# Patient Record
Sex: Male | Born: 1950 | Hispanic: No | Marital: Married | Smoking: Never smoker
Health system: Southern US, Community
[De-identification: ages and names within clinical notes are randomized; demographics above are authoritative.]

## PROBLEM LIST (undated history)

## (undated) DIAGNOSIS — I10 Essential (primary) hypertension: Secondary | ICD-10-CM

## (undated) DIAGNOSIS — C801 Malignant (primary) neoplasm, unspecified: Secondary | ICD-10-CM

## (undated) DIAGNOSIS — Z800 Family history of malignant neoplasm of digestive organs: Secondary | ICD-10-CM

## (undated) DIAGNOSIS — G4730 Sleep apnea, unspecified: Secondary | ICD-10-CM

## (undated) DIAGNOSIS — Z8546 Personal history of malignant neoplasm of prostate: Secondary | ICD-10-CM

## (undated) DIAGNOSIS — E079 Disorder of thyroid, unspecified: Secondary | ICD-10-CM

## (undated) DIAGNOSIS — Z9689 Presence of other specified functional implants: Secondary | ICD-10-CM

## (undated) DIAGNOSIS — R809 Proteinuria, unspecified: Secondary | ICD-10-CM

## (undated) DIAGNOSIS — F909 Attention-deficit hyperactivity disorder, unspecified type: Secondary | ICD-10-CM

## (undated) DIAGNOSIS — I8390 Asymptomatic varicose veins of unspecified lower extremity: Secondary | ICD-10-CM

## (undated) DIAGNOSIS — T7840XA Allergy, unspecified, initial encounter: Secondary | ICD-10-CM

## (undated) DIAGNOSIS — G4700 Insomnia, unspecified: Secondary | ICD-10-CM

## (undated) DIAGNOSIS — K219 Gastro-esophageal reflux disease without esophagitis: Secondary | ICD-10-CM

## (undated) HISTORY — DX: Allergy, unspecified, initial encounter: T78.40XA

## (undated) HISTORY — DX: Insomnia, unspecified: G47.00

## (undated) HISTORY — DX: Personal history of malignant neoplasm of prostate: Z85.46

## (undated) HISTORY — DX: Essential (primary) hypertension: I10

## (undated) HISTORY — DX: Disorder of thyroid, unspecified: E07.9

## (undated) HISTORY — DX: Asymptomatic varicose veins of unspecified lower extremity: I83.90

## (undated) HISTORY — DX: Attention-deficit hyperactivity disorder, unspecified type: F90.9

## (undated) HISTORY — DX: Family history of malignant neoplasm of digestive organs: Z80.0

## (undated) HISTORY — DX: Proteinuria, unspecified: R80.9

## (undated) HISTORY — PX: PENILE PROSTHESIS IMPLANT: SHX240

---
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
## 2009-01-12 HISTORY — PX: HERNIA REPAIR: SHX51

## 2009-02-01 ENCOUNTER — Ambulatory Visit: Payer: Self-pay | Admitting: General Surgery

## 2009-11-01 ENCOUNTER — Ambulatory Visit: Payer: Self-pay | Admitting: Family Medicine

## 2010-01-12 DIAGNOSIS — C801 Malignant (primary) neoplasm, unspecified: Secondary | ICD-10-CM

## 2010-01-12 HISTORY — DX: Malignant (primary) neoplasm, unspecified: C80.1

## 2010-01-12 HISTORY — PX: PROSTATECTOMY: SHX69

## 2010-04-10 ENCOUNTER — Ambulatory Visit: Payer: Self-pay | Admitting: Urology

## 2012-03-29 DIAGNOSIS — G4721 Circadian rhythm sleep disorder, delayed sleep phase type: Secondary | ICD-10-CM | POA: Insufficient documentation

## 2012-04-18 DIAGNOSIS — G4733 Obstructive sleep apnea (adult) (pediatric): Secondary | ICD-10-CM | POA: Insufficient documentation

## 2013-03-27 IMAGING — CT CT ABDOMEN AND PELVIS WITHOUT AND WITH CONTRAST
2 of 4 series · 14 of 32 positions shown, 19 images · IV contrast (isovue)
Comparison: none

REASON FOR EXAM: prostate cancer
COMMENTS:

PROCEDURE:     KCT - KCT ABDOMEN/PELVIS W/WO  - April 10, 2010 [DATE]
RESULT:     Comparison:  None
TECHNIQUE: Multiple axial images of the abdomen and pelvis were performed
from the lung bases to the pubic symphysis, with p.o. contrast and with 85
mL of Isovue 370 intravenous contrast. Precontrast and delayed phase images
were also performed.

[Series 4: abd with 5.0 i40f · axial · 0.74mm/px · z∈[-938,-588]mm · 8 of 92 slices shown, 13 images]
[im 11/92  soft-tissue]
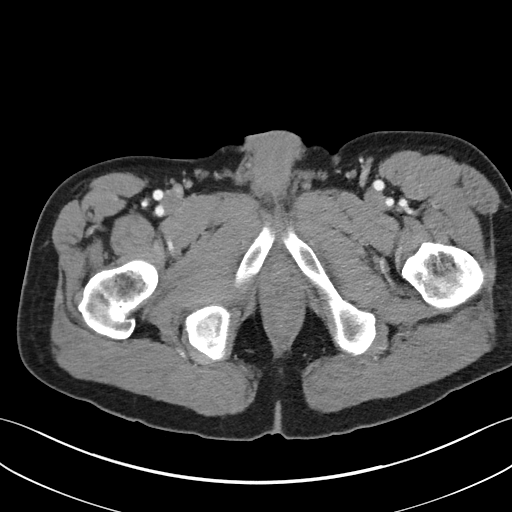
[im 11/92  bone]
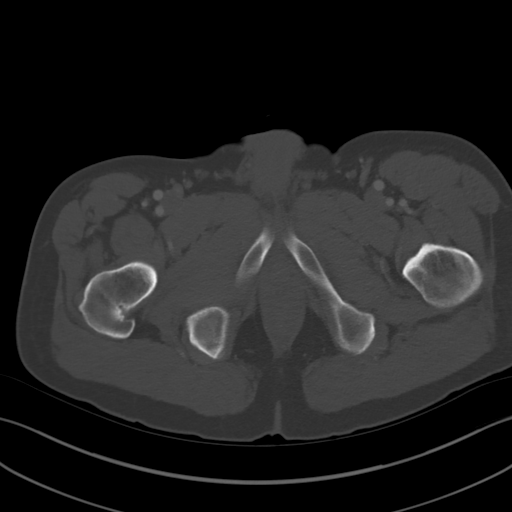
[im 21/92  soft-tissue]
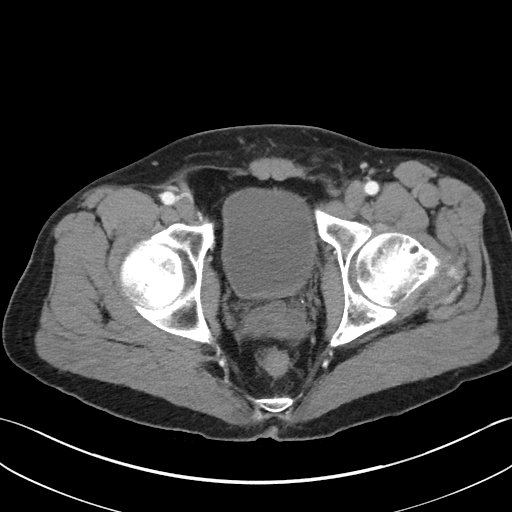
[im 31/92  soft-tissue]
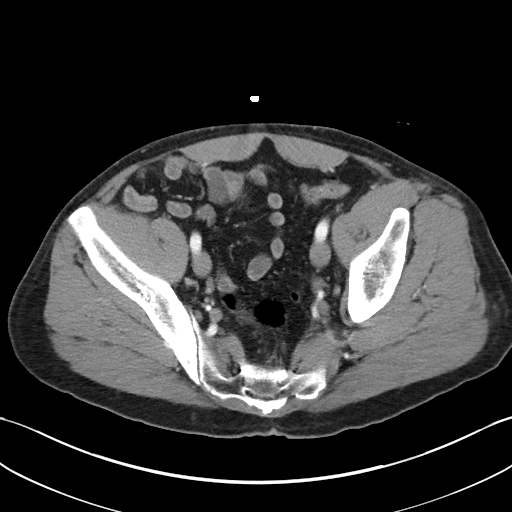
[im 41/92  soft-tissue]
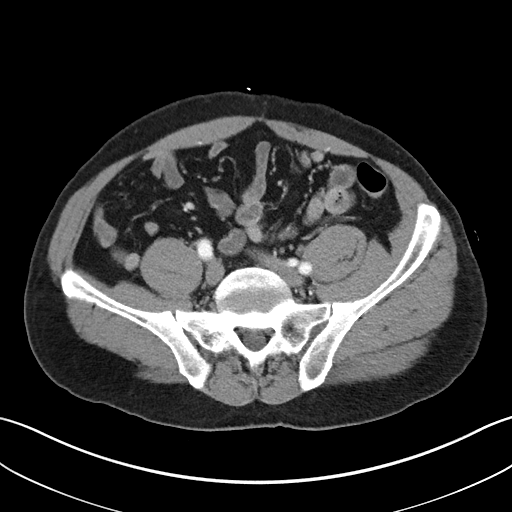
[im 51/92  soft-tissue]
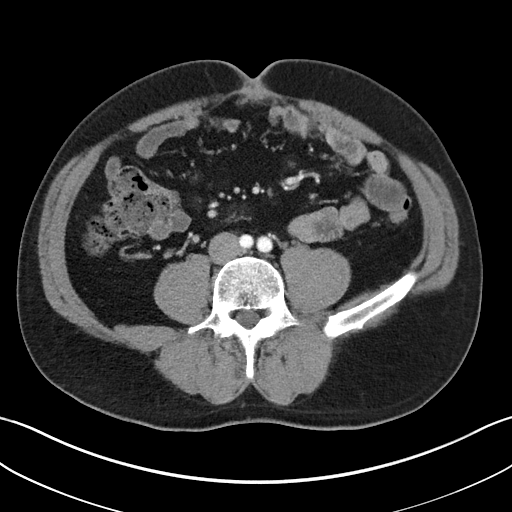
[im 51/92  lung]
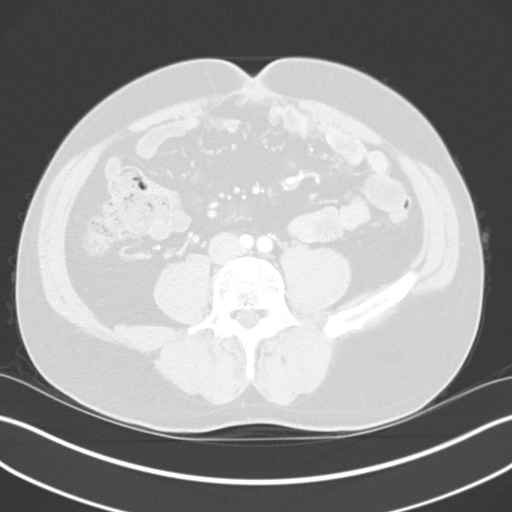
[im 61/92  soft-tissue]
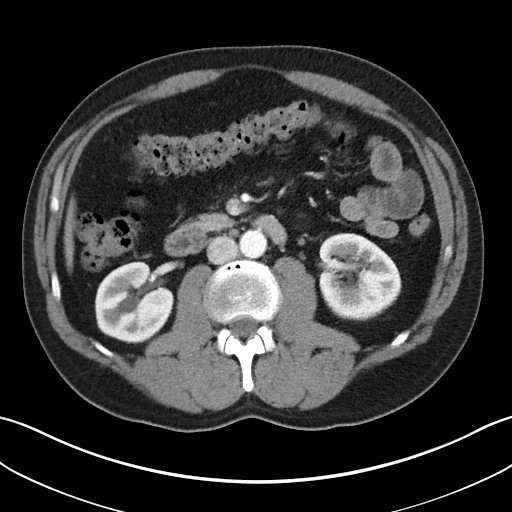
[im 61/92  lung]
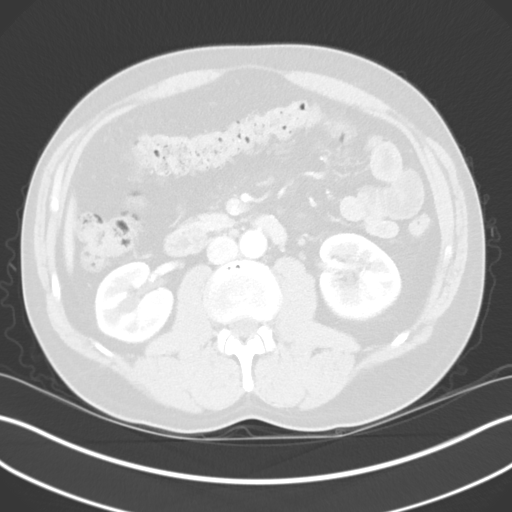
[im 71/92  soft-tissue]
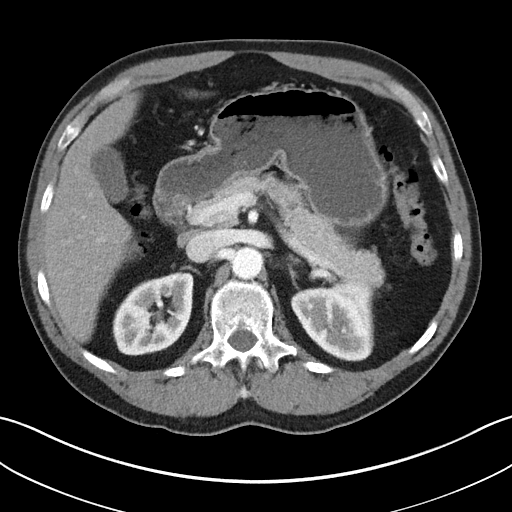
[im 71/92  lung]
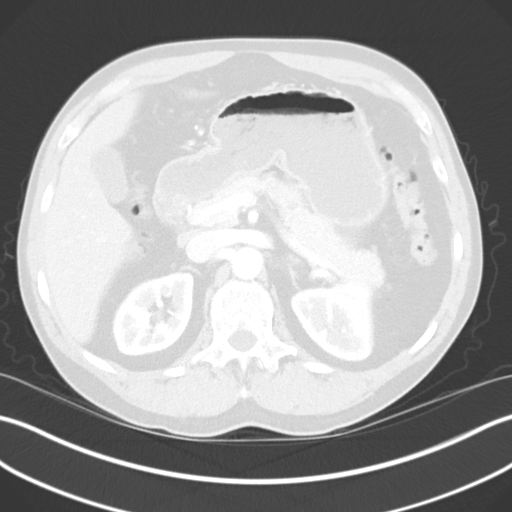
[im 81/92  soft-tissue]
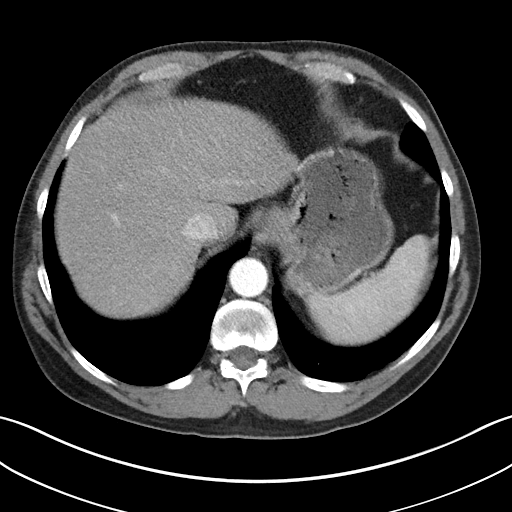
[im 81/92  lung]
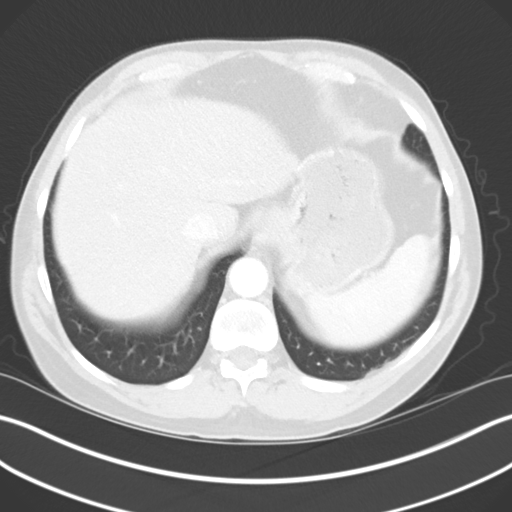

[Series 6: abd delay 5.0 i40f · axial · delayed · 0.74mm/px · z∈[-938,-688]mm · 6 of 92 slices shown]
[im 11/92  soft-tissue]
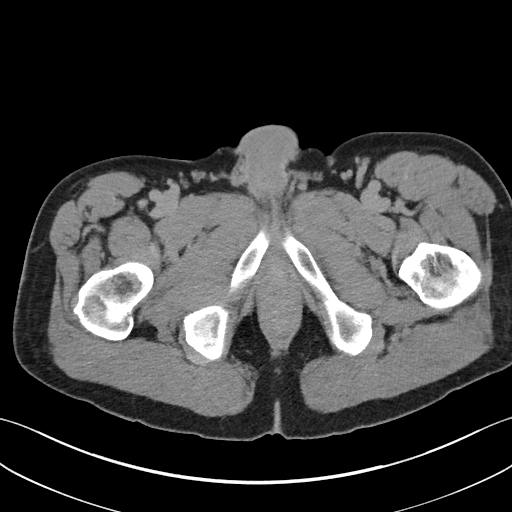
[im 21/92  soft-tissue]
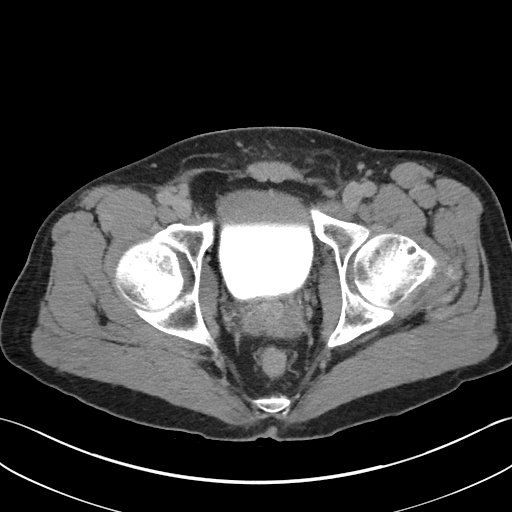
[im 31/92  soft-tissue]
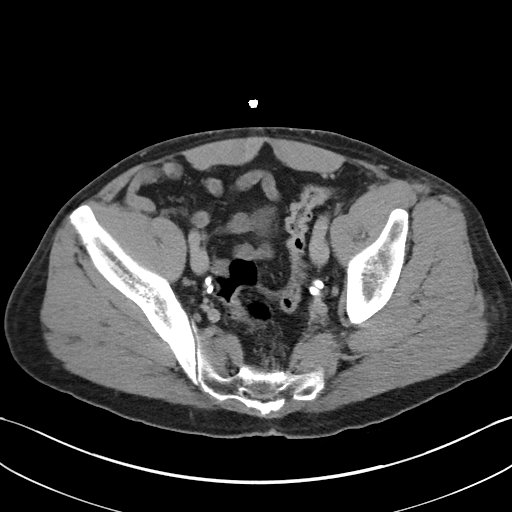
[im 41/92  soft-tissue]
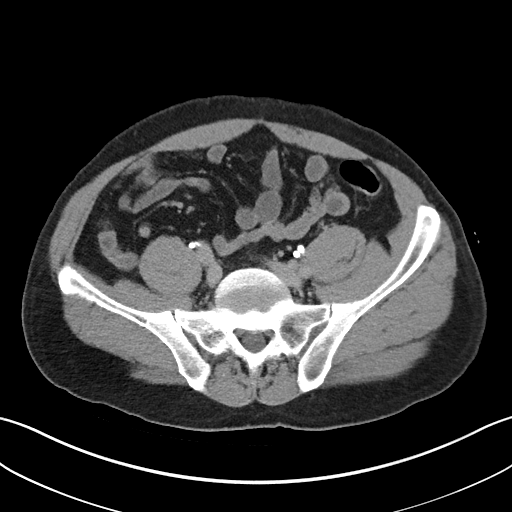
[im 51/92  soft-tissue]
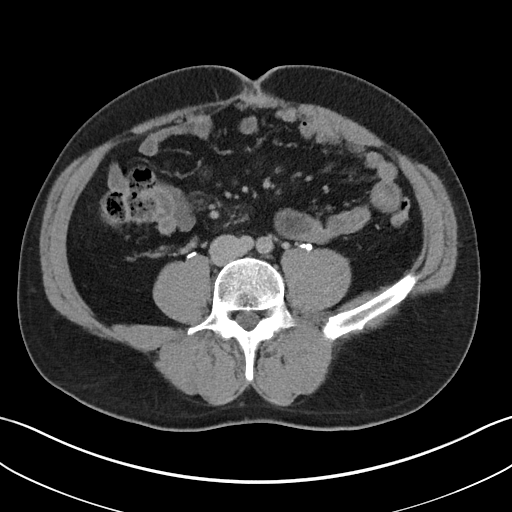
[im 61/92  soft-tissue]
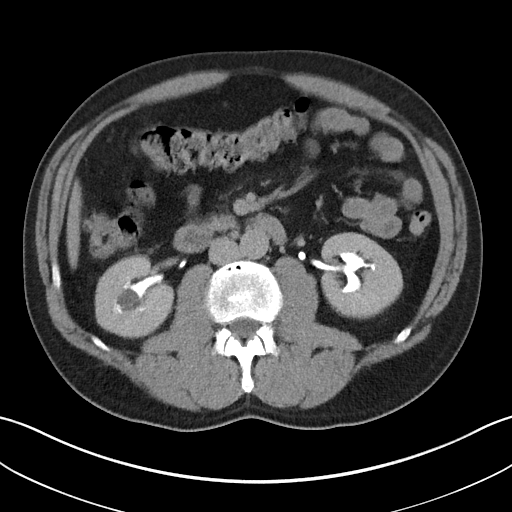

[14 of 32 positions shown; findings below may reference images not displayed]

FINDINGS: The liver, adrenals, pancreas, and gallbladder are unremarkable. Punctate
calcifications in the spleen are likely sequela of old prior infection. The
kidneys enhance normally.

There is a small round focus of hyperenhancement in the left posterior
aspect of the prostate which measures 1.1 cm in diameter, as seen on image
79. No lymphadenopathy identified in the retroperitoneum, mesentery, or
pelvis. There is a fat-containing left inguinal hernia. There is mild
stranding in the fat of the mesentery in the region of the mesenteric
vessels. This is nonspecific.

Evaluation of the delayed phase images demonstrates the bilateral ureters
are duplicated. There is a very small diverticulum extending from the right
posterior aspect of the bladder.

No aggressive lytic or sclerotic osseous lesions identified.
IMPRESSION: 1. Small hyperenhancing round focus in the prostate is nonspecific, prostate
cancer is a differential consideration.
2. No lymphadenopathy in the abdomen or pelvis.
3. Mild fat stranding in the mesentery is nonspecific. This can be seen with
mesenteritis or panniculitis.

## 2014-01-12 HISTORY — PX: COLONOSCOPY: SHX174

## 2014-05-14 DIAGNOSIS — N393 Stress incontinence (female) (male): Secondary | ICD-10-CM | POA: Insufficient documentation

## 2014-08-23 ENCOUNTER — Encounter (INDEPENDENT_AMBULATORY_CARE_PROVIDER_SITE_OTHER): Payer: Self-pay

## 2014-08-23 ENCOUNTER — Ambulatory Visit (INDEPENDENT_AMBULATORY_CARE_PROVIDER_SITE_OTHER): Payer: BLUE CROSS/BLUE SHIELD | Admitting: Family Medicine

## 2014-08-23 ENCOUNTER — Encounter: Payer: Self-pay | Admitting: Family Medicine

## 2014-08-23 VITALS — BP 106/64 | HR 91 | Temp 98.3°F | Resp 18 | Ht 76.0 in | Wt 199.5 lb

## 2014-08-23 DIAGNOSIS — K432 Incisional hernia without obstruction or gangrene: Secondary | ICD-10-CM | POA: Diagnosis not present

## 2014-08-23 DIAGNOSIS — Z23 Encounter for immunization: Secondary | ICD-10-CM

## 2014-08-23 DIAGNOSIS — K589 Irritable bowel syndrome without diarrhea: Secondary | ICD-10-CM

## 2014-08-23 DIAGNOSIS — F419 Anxiety disorder, unspecified: Secondary | ICD-10-CM

## 2014-08-23 DIAGNOSIS — G4700 Insomnia, unspecified: Secondary | ICD-10-CM | POA: Insufficient documentation

## 2014-08-23 MED ORDER — PANTOPRAZOLE SODIUM 40 MG PO TBEC: 40.0000 mg | DELAYED_RELEASE_TABLET | Freq: Every day | ORAL | Status: DC

## 2014-08-23 MED ORDER — ALPRAZOLAM 0.5 MG PO TABS: ORAL_TABLET | ORAL | Status: DC

## 2014-08-23 MED ORDER — ZOLPIDEM TARTRATE 10 MG PO TABS: 10.0000 mg | ORAL_TABLET | Freq: Every day | ORAL | Status: DC

## 2014-08-23 NOTE — Progress Notes (Signed)
Name: Brandon Huffman   MRN: [id number]    DOB: [birth year]   Date:08/23/2014       Progress Note  Subjective  Chief Complaint  Chief Complaint  Patient presents with  . Anxiety  . Insomnia    Anxiety Presents for follow-up visit. Symptoms include excessive worry, hyperventilation, insomnia, irritability, muscle tension, palpitations, restlessness and shortness of breath. Patient reports no chest pain, dizziness, nausea or nervous/anxious behavior. Symptoms occur occasionally. The severity of symptoms is moderate. The symptoms are aggravated by caffeine, social activities and work stress. The quality of sleep is good. Nighttime awakenings: occasional.   His past medical history is significant for anxiety/panic attacks. Past treatments include benzodiazephines. The treatment provided moderate relief. Compliance with prior treatments has been good. Compliance with medications is 76-100%.  Insomnia Primary symptoms: fragmented sleep, sleep disturbance, difficulty falling asleep, frequent awakening.  The current episode started more than one year. The onset quality is gradual. The problem occurs nightly. The problem is unchanged. The symptoms are aggravated by anxiety and work stress. The treatment provided moderate relief. PMH includes: no depression.   Incisional hernia.  Patient has had nasal herniorrhaphy as well as radical prostatectomy. Robotically performed. He has a small bulge above the umbilicus which is tender intermittently.   Past Medical History  Diagnosis Date  . Allergy   . Anxiety   . Insomnia    history of robotic prostatectomy History of umbilical surgery and inguinal hernia surgery  Social History  Substance Use Topics  . Smoking status: Never Smoker   . Smokeless tobacco: Not on file  . Alcohol Use: No     Current outpatient prescriptions:  .  aspirin 81 MG chewable tablet, Chew 81 mg by mouth., Disp: , Rfl:  .  fluticasone (FLONASE) 50 MCG/ACT nasal spray, ,  Disp: , Rfl:  .  gabapentin (NEURONTIN) 300 MG capsule, , Disp: , Rfl:  .  ALPRAZolam (XANAX) 0.5 MG tablet, TAKE 1 TABLET BY MOUTH 2 TIMES A DAY AS NEEDED, Disp: , Rfl: 4 .  fexofenadine (ALLEGRA) 180 MG tablet, , Disp: , Rfl:  .  Melatonin 3 MG TABS, Take by mouth., Disp: , Rfl:  .  Multiple Vitamins-Minerals (MULTIVITAMIN WITH MINERALS) tablet, Take by mouth., Disp: , Rfl:  .  pantoprazole (PROTONIX) 40 MG tablet, , Disp: , Rfl: 2 .  zolpidem (AMBIEN) 10 MG tablet, Take 10 mg by mouth at bedtime., Disp: , Rfl: 5  Allergies  Allergen Reactions  . Tetracyclines & Related Hives and Rash    Review of Systems  Constitutional: Positive for irritability. Negative for fever, chills and weight loss.  HENT: Negative for congestion, hearing loss, sore throat and tinnitus.   Eyes: Negative for blurred vision, double vision and redness.  Respiratory: Positive for shortness of breath. Negative for cough and hemoptysis.   Cardiovascular: Positive for palpitations. Negative for chest pain, orthopnea, claudication and leg swelling.  Gastrointestinal: Negative for heartburn, nausea, vomiting, diarrhea, constipation and blood in stool.  Genitourinary: Negative for dysuria, urgency, frequency and hematuria.  Musculoskeletal: Negative for myalgias, back pain, joint pain, falls and neck pain.  Skin: Negative for itching.  Neurological: Negative for dizziness, tingling, tremors, focal weakness, seizures, loss of consciousness, weakness and headaches.  Endo/Heme/Allergies: Does not bruise/bleed easily.  Psychiatric/Behavioral: Positive for sleep disturbance. Negative for depression and substance abuse. The patient has insomnia. The patient is not nervous/anxious.      Objective  Filed Vitals:   08/23/14 0735  BP: 106/64  Pulse: 91  Temp: 98.3 F (36.8 C)  TempSrc: Oral  Resp: 18  Height: 6\' 4"  (1.93 m)  Weight: 199 lb 8 oz (90.493 kg)  SpO2: 98%     Physical Exam  Constitutional: He is  oriented to person, place, and time and well-developed, well-nourished, and in no distress.  HENT:  Head: Normocephalic.  Eyes: EOM are normal. Pupils are equal, round, and reactive to light.  Neck: Normal range of motion. Neck supple. No thyromegaly present.  Cardiovascular: Normal rate, regular rhythm and normal heart sounds.   No murmur heard. Pulmonary/Chest: Effort normal and breath sounds normal. No respiratory distress. He has no wheezes.  Abdominal: He exhibits mass. There is tenderness.  Surgical scars noted from prior prostatectomy and umbilical hernia surgery. There's a slight bulge bulge just left to the umbilicus but no incarceration of small incisional hernia.  Musculoskeletal: Normal range of motion. He exhibits no edema.  Lymphadenopathy:    He has no cervical adenopathy.  Neurological: He is alert and oriented to person, place, and time. No cranial nerve deficit. Gait normal. Coordination normal.  Skin: Skin is warm and dry. No rash noted.  Psychiatric: Judgment normal.  Slightly anxious and loquacious      Assessment & Plan  1. Insomnia  - zolpidem (AMBIEN) 10 MG tablet; Take 1 tablet (10 mg total) by mouth at bedtime.  Dispense: 30 tablet; Refill: 5  2. Acute anxiety  - ALPRAZolam (XANAX) 0.5 MG tablet; TAKE 1 TABLET BY MOUTH 2 TIMES A DAY AS NEEDED  Dispense: 60 tablet; Refill: 5  3. Incisional hernia, without obstruction or gangrene  4. Need for shingles vaccine  - Varicella-zoster vaccine subcutaneous  5. Irritable bowel syndrome  Stable

## 2014-09-30 ENCOUNTER — Other Ambulatory Visit: Payer: Self-pay | Admitting: Family Medicine

## 2014-10-01 NOTE — Telephone Encounter (Signed)
Written on last ov If not, ok to call for now

## 2014-10-09 NOTE — Telephone Encounter (Signed)
Script faxed to pharmacy

## 2014-11-16 DIAGNOSIS — R972 Elevated prostate specific antigen [PSA]: Secondary | ICD-10-CM | POA: Insufficient documentation

## 2014-11-18 ENCOUNTER — Other Ambulatory Visit: Payer: Self-pay | Admitting: Family Medicine

## 2015-02-25 ENCOUNTER — Encounter: Payer: BLUE CROSS/BLUE SHIELD | Admitting: Family Medicine

## 2015-03-21 ENCOUNTER — Ambulatory Visit (INDEPENDENT_AMBULATORY_CARE_PROVIDER_SITE_OTHER): Payer: BLUE CROSS/BLUE SHIELD | Admitting: Family Medicine

## 2015-03-21 ENCOUNTER — Encounter: Payer: Self-pay | Admitting: Family Medicine

## 2015-03-21 VITALS — BP 120/72 | HR 99 | Temp 98.1°F | Resp 18 | Ht 76.0 in | Wt 188.4 lb

## 2015-03-21 DIAGNOSIS — Z0000 Encounter for general adult medical examination without abnormal findings: Secondary | ICD-10-CM | POA: Diagnosis not present

## 2015-03-21 NOTE — Progress Notes (Signed)
Name: Brandon Huffman   MRN: [id number]    DOB: 07/28/1950   Date:03/21/2015       Progress Note  Subjective  Chief Complaint  Chief Complaint  Patient presents with  . Annual Exam    HPI  Pt. Is here for a CPE.  Colonoscopy was 11/2012, repeat due 11/2015. Had radical prostatectomy in 2012, follows up with Urology with regular PSA draws.  Past Medical History  Diagnosis Date  . Allergy   . Anxiety   . Insomnia   . Insomnia     Past Surgical History  Procedure Laterality Date  . Colonoscopy  2016  . Prostatectomy  2012    Radical Prostatectomy.  Marland Kitchen Hernia repair    . Varicose vein surgery Left 1972    Family History  Problem Relation Age of Onset  . Cancer Sister     Colon CA  . Cancer Brother     Prostate CA  . Pneumonia Mother     Social History   Social History  . Marital Status: Married    Spouse Name: N/A  . Number of Children: N/A  . Years of Education: N/A   Occupational History  . Not on file.   Social History Main Topics  . Smoking status: Never Smoker   . Smokeless tobacco: Not on file  . Alcohol Use: 1.2 oz/week    2 Glasses of wine, 0 Standard drinks or equivalent per week  . Drug Use: No  . Sexual Activity:    Partners: Female   Other Topics Concern  . Not on file   Social History Narrative     Current outpatient prescriptions:  .  ALPRAZolam (XANAX) 0.5 MG tablet, TAKE 1 TABLET BY MOUTH 2 TIMES A DAY AS NEEDED, Disp: 60 tablet, Rfl: 5 .  aspirin 81 MG chewable tablet, Chew 81 mg by mouth., Disp: , Rfl:  .  fluticasone (FLONASE) 50 MCG/ACT nasal spray, Reported on 03/21/2015, Disp: , Rfl:  .  Melatonin 3 MG TABS, Take by mouth. Reported on 03/21/2015, Disp: , Rfl:  .  pantoprazole (PROTONIX) 40 MG tablet, TAKE 1 TABLET EVERY DAY, Disp: 90 tablet, Rfl: 3 .  zolpidem (AMBIEN) 10 MG tablet, TAKE 1 TABLET BY MOUTH AT BEDTIME, Disp: 30 tablet, Rfl: 0 .  fexofenadine (ALLEGRA) 180 MG tablet, Reported on 03/21/2015, Disp: , Rfl:  .  gabapentin  (NEURONTIN) 300 MG capsule, Reported on 03/21/2015, Disp: , Rfl:  .  Humidifier MISC, , Disp: , Rfl:  .  Multiple Vitamins-Minerals (MULTIVITAMIN WITH MINERALS) tablet, Take by mouth., Disp: , Rfl:   Allergies  Allergen Reactions  . Tetracyclines & Related Hives and Rash     Review of Systems  Constitutional: Negative for fever, chills, weight loss and malaise/fatigue.  HENT: Negative for congestion, ear pain and hearing loss.   Eyes: Negative for blurred vision and double vision.  Respiratory: Negative for cough, shortness of breath and wheezing.   Cardiovascular: Negative for chest pain and leg swelling.  Gastrointestinal: Positive for heartburn (controlled on PPI). Negative for nausea, vomiting, abdominal pain, blood in stool and melena.  Genitourinary: Negative for dysuria, frequency and hematuria.  Musculoskeletal: Negative for myalgias, back pain and neck pain.  Skin: Negative for rash.  Neurological: Negative for dizziness and headaches.  Psychiatric/Behavioral: Negative for depression. The patient has insomnia. The patient is not nervous/anxious.     Objective  Filed Vitals:   03/21/15 0905  BP: 120/72  Pulse: 99  Temp: 98.1 F (  36.7 C)  TempSrc: Oral  Resp: 18  Height: 6\' 4"  (1.93 m)  Weight: 188 lb 6.4 oz (85.458 kg)  SpO2: 97%    Physical Exam  Constitutional: He is oriented to person, place, and time and well-developed, well-nourished, and in no distress.  HENT:  Head: Normocephalic and atraumatic.  Eyes: Pupils are equal, round, and reactive to light.  Cardiovascular: Normal rate and regular rhythm.   Pulmonary/Chest: Effort normal and breath sounds normal.  Abdominal: Soft. Bowel sounds are normal.  Neurological: He is alert and oriented to person, place, and time.  Nursing note and vitals reviewed.     Assessment & Plan  1. Annual physical exam  - Lipid Profile - CBC with Differential - Comprehensive Metabolic Panel (CMET) - TSH - Vitamin D  (25 hydroxy)   Carine Nordgren [facility] 03/21/2015 9:46 AM

## 2015-04-04 LAB — CBC WITH DIFFERENTIAL/PLATELET
Basophils Absolute: 0 10*3/uL (ref 0.0–0.2)
Basos: 1 %
EOS (ABSOLUTE): 0.1 10*3/uL (ref 0.0–0.4)
Eos: 2 %
Hematocrit: 41.3 % (ref 37.5–51.0)
Hemoglobin: 14.2 g/dL (ref 12.6–17.7)
Immature Grans (Abs): 0 10*3/uL (ref 0.0–0.1)
Immature Granulocytes: 0 %
LYMPHS: 30 %
Lymphocytes Absolute: 1.5 10*3/uL (ref 0.7–3.1)
MCH: 28.8 pg (ref 26.6–33.0)
MCHC: 34.4 g/dL (ref 31.5–35.7)
MCV: 84 fL (ref 79–97)
Monocytes Absolute: 0.3 10*3/uL (ref 0.1–0.9)
Monocytes: 7 %
NEUTROS ABS: 2.9 10*3/uL (ref 1.4–7.0)
Neutrophils: 60 %
Platelets: 191 10*3/uL (ref 150–379)
RBC: 4.93 x10E6/uL (ref 4.14–5.80)
RDW: 15.7 % — ABNORMAL HIGH (ref 12.3–15.4)
WBC: 4.8 10*3/uL (ref 3.4–10.8)

## 2015-04-04 LAB — COMPREHENSIVE METABOLIC PANEL
ALT: 45 IU/L — ABNORMAL HIGH (ref 0–44)
AST: 41 IU/L — ABNORMAL HIGH (ref 0–40)
Albumin/Globulin Ratio: 1 — ABNORMAL LOW (ref 1.2–2.2)
Albumin: 4.1 g/dL (ref 3.6–4.8)
Alkaline Phosphatase: 94 IU/L (ref 39–117)
BUN/Creatinine Ratio: 10 (ref 10–22)
BUN: 9 mg/dL (ref 8–27)
Bilirubin Total: 0.3 mg/dL (ref 0.0–1.2)
CO2: 23 mmol/L (ref 18–29)
Calcium: 9.8 mg/dL (ref 8.6–10.2)
Chloride: 100 mmol/L (ref 96–106)
Creatinine, Ser: 0.94 mg/dL (ref 0.76–1.27)
GFR calc Af Amer: 99 mL/min/{1.73_m2} (ref >59)
GFR calc non Af Amer: 86 mL/min/{1.73_m2} (ref >59)
GLUCOSE: 97 mg/dL (ref 65–99)
Globulin, Total: 4.3 g/dL (ref 1.5–4.5)
Potassium: 5.2 mmol/L (ref 3.5–5.2)
Sodium: 138 mmol/L (ref 134–144)
Total Protein: 8.4 g/dL (ref 6.0–8.5)

## 2015-04-04 LAB — LIPID PANEL
CHOL/HDL RATIO: 2.6 ratio (ref 0.0–5.0)
CHOLESTEROL TOTAL: 135 mg/dL (ref 100–199)
HDL: 52 mg/dL (ref >39)
LDL Calculated: 68 mg/dL (ref 0–99)
TRIGLYCERIDES: 74 mg/dL (ref 0–149)
VLDL Cholesterol Cal: 15 mg/dL (ref 5–40)

## 2015-04-04 LAB — TSH: TSH: 2.66 u[IU]/mL (ref 0.450–4.500)

## 2015-04-04 LAB — VITAMIN D 25 HYDROXY (VIT D DEFICIENCY, FRACTURES): Vit D, 25-Hydroxy: 29 ng/mL — ABNORMAL LOW (ref 30.0–100.0)

## 2015-04-24 ENCOUNTER — Other Ambulatory Visit: Payer: Self-pay | Admitting: Family Medicine

## 2015-11-05 ENCOUNTER — Other Ambulatory Visit: Payer: Self-pay | Admitting: Family Medicine

## 2015-12-13 HISTORY — PX: OTHER SURGICAL HISTORY: SHX169

## 2016-03-23 ENCOUNTER — Encounter: Payer: PRIVATE HEALTH INSURANCE | Admitting: Family Medicine

## 2016-05-08 ENCOUNTER — Ambulatory Visit (INDEPENDENT_AMBULATORY_CARE_PROVIDER_SITE_OTHER): Payer: BLUE CROSS/BLUE SHIELD | Admitting: Family Medicine

## 2016-05-08 ENCOUNTER — Encounter: Payer: Self-pay | Admitting: Family Medicine

## 2016-05-08 DIAGNOSIS — Z8546 Personal history of malignant neoplasm of prostate: Secondary | ICD-10-CM | POA: Diagnosis not present

## 2016-05-08 DIAGNOSIS — Z800 Family history of malignant neoplasm of digestive organs: Secondary | ICD-10-CM | POA: Diagnosis not present

## 2016-05-08 DIAGNOSIS — F5101 Primary insomnia: Secondary | ICD-10-CM

## 2016-05-08 DIAGNOSIS — Z0000 Encounter for general adult medical examination without abnormal findings: Secondary | ICD-10-CM | POA: Diagnosis not present

## 2016-05-08 DIAGNOSIS — Z23 Encounter for immunization: Secondary | ICD-10-CM

## 2016-05-08 DIAGNOSIS — K432 Incisional hernia without obstruction or gangrene: Secondary | ICD-10-CM | POA: Diagnosis not present

## 2016-05-08 HISTORY — DX: Family history of malignant neoplasm of digestive organs: Z80.0

## 2016-05-08 HISTORY — DX: Personal history of malignant neoplasm of prostate: Z85.46

## 2016-05-08 LAB — CBC WITH DIFFERENTIAL/PLATELET
BASOS ABS: 49 {cells}/uL (ref 0–200)
Basophils Relative: 1 %
Eosinophils Absolute: 98 cells/uL (ref 15–500)
Eosinophils Relative: 2 %
HEMATOCRIT: 43.7 % (ref 38.5–50.0)
HEMOGLOBIN: 14.5 g/dL (ref 13.2–17.1)
LYMPHS ABS: 1323 {cells}/uL (ref 850–3900)
Lymphocytes Relative: 27 %
MCH: 28.3 pg (ref 27.0–33.0)
MCHC: 33.2 g/dL (ref 32.0–36.0)
MCV: 85.4 fL (ref 80.0–100.0)
MONO ABS: 343 {cells}/uL (ref 200–950)
MPV: 10.3 fL (ref 7.5–12.5)
Monocytes Relative: 7 %
Neutro Abs: 3087 cells/uL (ref 1500–7800)
Neutrophils Relative %: 63 %
Platelets: 187 10*3/uL (ref 140–400)
RBC: 5.12 MIL/uL (ref 4.20–5.80)
RDW: 15.5 % — ABNORMAL HIGH (ref 11.0–15.0)
WBC: 4.9 10*3/uL (ref 3.8–10.8)

## 2016-05-08 LAB — LIPID PANEL
Cholesterol: 161 mg/dL (ref <200)
HDL: 41 mg/dL (ref >40)
LDL CALC: 89 mg/dL (ref <100)
TRIGLYCERIDES: 157 mg/dL — AB (ref <150)
Total CHOL/HDL Ratio: 3.9 Ratio (ref <5.0)
VLDL: 31 mg/dL — AB (ref <30)

## 2016-05-08 LAB — COMPREHENSIVE METABOLIC PANEL
ALBUMIN: 4.2 g/dL (ref 3.6–5.1)
ALT: 31 U/L (ref 9–46)
AST: 28 U/L (ref 10–35)
Alkaline Phosphatase: 71 U/L (ref 40–115)
BILIRUBIN TOTAL: 0.4 mg/dL (ref 0.2–1.2)
BUN: 10 mg/dL (ref 7–25)
CALCIUM: 9.3 mg/dL (ref 8.6–10.3)
CHLORIDE: 103 mmol/L (ref 98–110)
CO2: 28 mmol/L (ref 20–31)
Creat: 0.84 mg/dL (ref 0.70–1.25)
Glucose, Bld: 88 mg/dL (ref 65–99)
Potassium: 4.9 mmol/L (ref 3.5–5.3)
Sodium: 136 mmol/L (ref 135–146)
Total Protein: 8.1 g/dL (ref 6.1–8.1)

## 2016-05-08 NOTE — Assessment & Plan Note (Signed)
Refer back to Dr. Alain Marion for colonoscopy

## 2016-05-08 NOTE — Assessment & Plan Note (Signed)
Discussed new Shingrix if he's interested; he'll read about it

## 2016-05-08 NOTE — Progress Notes (Signed)
Patient ID: Brandon Huffman, male   DOB: 26-Apr-1950, 66 y.o.   MRN: [id number]   Subjective:   Brandon Huffman is a 66 y.o. male here for a complete physical exam  Interim issues since last visit: no major issues; artifical sphincter implant; deactived it and will see Dr. Odis Luster; a little painful Last physical was March 2017 with Dr. Manuella Ghazi Had alprazolam for sleep, not anxiety Hernia, not incarcerated Insomnia; does melatonin; no longer on alprazolam, has used ambien at times; sleep cycle gets off working on PhD Taking Vyvanse from psychiatrist, Dr. Bernita Raisin; formal ADHD adult  USPSTF grade A and B recommendations Depression:  Depression screen [facility] 2/9 05/08/2016 03/21/2015  Decreased Interest 0 -  Down, Depressed, Hopeless 0 0  PHQ - 2 Score 0 0   Hypertension: BP Readings from Last 3 Encounters:  05/08/16 120/74  03/21/15 120/72  08/23/14 106/64   Obesity: Wt Readings from Last 3 Encounters:  05/08/16 199 lb 3.2 oz (90.4 kg)  03/21/15 188 lb 6.4 oz (85.5 kg)  08/23/14 199 lb 8 oz (90.5 kg)   BMI Readings from Last 3 Encounters:  05/08/16 24.57 kg/m  03/21/15 22.93 kg/m  08/23/14 24.28 kg/m    Alcohol: one glass of wine every other day Tobacco use: no HIV, hep B, hep C:  Hep C UTD, declined HIV STD testing and prevention (chl/gon/syphilis): declined Lipids: doing flax seed omega Lab Results  Component Value Date   CHOL 135 04/03/2015   Lab Results  Component Value Date   HDL 52 04/03/2015   Lab Results  Component Value Date   LDLCALC 68 04/03/2015   Lab Results  Component Value Date   TRIG 74 04/03/2015   Lab Results  Component Value Date   CHOLHDL 2.6 04/03/2015   No results found for: LDLDIRECT  Glucose:  Glucose  Date Value Ref Range Status  04/03/2015 97 65 - 99 mg/dL Final    Colorectal cancer: due now for colonoscopy, referral placed Prostate cancer: amanged by urologist No results found for: PSA PSA 0.1 in Nov 2017, 0.06 in Jan  2018 Goes back May 4th Lung cancer:  never AAA: no fam hx of AAA Aspirin: taking 81 mg daily Diet: pretty good eater, lots of fish, very little fried foods Exercise: tries to exercise Skin cancer: check back  Past Medical History:  Diagnosis Date  . Adult ADHD    Dr. Rosine Door  . Allergy   . Family hx of colon cancer 05/08/2016  . Hx of malignant neoplasm of prostate 05/08/2016   2012, s/p radical prostatectomy, urologist is Dr. Tresa Endo [facility]  . Hx of malignant neoplasm of prostate 05/08/2016   2012, s/p radical prostatectomy, urologist is Dr. Tresa Endo [facility]  . Insomnia    Past Surgical History:  Procedure Laterality Date  . artificaill sphincter    . COLONOSCOPY  2016  . HERNIA REPAIR    . PROSTATECTOMY  2012   Radical Prostatectomy.  Marland Kitchen VARICOSE VEIN SURGERY Left 1972   Family History  Problem Relation Age of Onset  . Cancer Sister     Colon CA  . Cancer Brother     Prostate CA  . Pneumonia Mother   . Diabetes Brother   . Cancer Sister     adrenal   . Cancer Brother     prostate-remission  . Irritable bowel syndrome Brother    Social History  Substance Use Topics  . Smoking status: Never Smoker  . Smokeless tobacco:  Never Used  . Alcohol use 1.2 oz/week    2 Glasses of wine per week   Review of Systems  Constitutional: Negative for unexpected weight change.  HENT: Negative for hearing loss.   Eyes: Negative for visual disturbance.  Cardiovascular: Negative for chest pain.  Gastrointestinal: Negative for blood in stool.  Genitourinary: Negative for hematuria.  Musculoskeletal: Positive for arthralgias (right pinky).  Allergic/Immunologic: Positive for food allergies (chocolate, cysts under the skin, avoids).  Neurological: Negative for tremors.  Hematological: Negative for adenopathy.  Psychiatric/Behavioral: Positive for sleep disturbance.    Objective:   Vitals:   05/08/16 1108  BP: 120/74  Pulse: 98  Resp: 14  Temp: 98.6 F (37 C)   TempSrc: Oral  SpO2: 97%  Weight: 199 lb 3.2 oz (90.4 kg)  Height: 6' 3.5" (1.918 m)   Body mass index is 24.57 kg/m. Wt Readings from Last 3 Encounters:  05/08/16 199 lb 3.2 oz (90.4 kg)  03/21/15 188 lb 6.4 oz (85.5 kg)  08/23/14 199 lb 8 oz (90.5 kg)   Physical Exam  Constitutional: He appears well-developed and well-nourished. No distress.  HENT:  Head: Normocephalic and atraumatic.  Right Ear: Tympanic membrane, external ear and ear canal normal.  Left Ear: Tympanic membrane, external ear and ear canal normal.  Nose: Nose normal.  Mouth/Throat: Oropharynx is clear and moist and mucous membranes are normal.  Scant cerumen inferiorly along right external auditory canal  Eyes: EOM are normal. No scleral icterus.  Neck: No JVD present. No thyromegaly present.  Cardiovascular: Normal rate, regular rhythm and normal heart sounds.   Pulmonary/Chest: Effort normal and breath sounds normal. No respiratory distress. He has no wheezes. He has no rales.  Abdominal: Soft. Bowel sounds are normal. He exhibits no distension. There is no tenderness. There is no guarding.  Musculoskeletal: Normal range of motion. He exhibits no edema.  Lymphadenopathy:    He has no cervical adenopathy.  Neurological: He is alert. He displays normal reflexes. He exhibits normal muscle tone. Coordination normal.  Skin: Skin is warm and dry. No rash noted. He is not diaphoretic. No erythema. No pallor.  Two dark brown keratotic lesions on back, one near center, other over left shoulder  Psychiatric: He has a normal mood and affect. His behavior is normal. Judgment and thought content normal.   Assessment/Plan:   Problem List Items Addressed This Visit      Other   Insomnia    Melatonin at exact same time of night; avoid caffeine 6-10 hours before bed      Incisional hernia, without obstruction or gangrene    Check today      Hx of malignant neoplasm of prostate    Managed by urologist       Family hx of colon cancer    Refer back to Dr. Alain Marion for colonoscopy      Relevant Orders   Ambulatory referral to Gastroenterology   Annual physical exam    USPSTF grade A and B recommendations reviewed with patient; age-appropriate recommendations, preventive care, screening tests, etc discussed and encouraged; healthy living encouraged; see AVS for patient education given to patient       Relevant Orders   CBC with Differential/Platelet   Comprehensive metabolic panel   Lipid panel      Meds ordered this encounter  Medications  . oxybutynin (DITROPAN-XL) 10 MG 24 hr tablet    Sig: Take 10 mg by mouth as needed.   Orders Placed This Encounter  Procedures  . CBC with Differential/Platelet  . Comprehensive metabolic panel  . Lipid panel  . Ambulatory referral to Gastroenterology    Referral Priority:   Routine    Referral Type:   Consultation    Referral Reason:   Specialty Services Required    Number of Visits Requested:   1   He'll consider Shingrix vaccine  Follow up plan: Return in about 1 year (around 05/08/2017) for [facility] to [facility]ness visit and physical, 40 minutes.  An After Visit Summary was printed and given to the patient.

## 2016-05-08 NOTE — Assessment & Plan Note (Signed)
Melatonin at exact same time of night; avoid caffeine 6-10 hours before bed

## 2016-05-08 NOTE — Assessment & Plan Note (Signed)
Check today 

## 2016-05-08 NOTE — Assessment & Plan Note (Signed)
USPSTF grade A and B recommendations reviewed with patient; age-appropriate recommendations, preventive care, screening tests, etc discussed and encouraged; healthy living encouraged; see AVS for patient education given to patient  

## 2016-05-08 NOTE — Assessment & Plan Note (Signed)
Managed by urologist 

## 2016-05-08 NOTE — Patient Instructions (Addendum)
I'll suggest 1,000 to 2,000 iu of vitamin D3 most days of the week You can check with your insurance company about Shingrix (shingles vaccine) Let's get labs today We'll refer you back to Dr. Alain Marion If you have not heard anything from my staff in a week about any orders/referrals/studies from today, please contact us here to follow-up [phone]   Health Maintenance, Male A healthy lifestyle and preventive care is important for your health and wellness. Ask your health care provider about what schedule of regular examinations is right for you. What should I know about weight and diet?  Eat a Healthy Diet  Eat plenty of vegetables, fruits, whole grains, low-fat dairy products, and lean protein.  Do not eat a lot of foods high in solid fats, added sugars, or salt. Maintain a Healthy Weight  Regular exercise can help you achieve or maintain a healthy weight. You should:  Do at least 150 minutes of exercise each week. The exercise should increase your heart rate and make you sweat (moderate-intensity exercise).  Do strength-training exercises at least twice a week. Watch Your Levels of Cholesterol and Blood Lipids  Have your blood tested for lipids and cholesterol every 5 years starting at 64 years of age. If you are at high risk for heart disease, you should start having your blood tested when you are 64 years old. You may need to have your cholesterol levels checked more often if:  Your lipid or cholesterol levels are high.  You are older than 64 years of age.  You are at high risk for heart disease. What should I know about cancer screening? Many types of cancers can be detected early and may often be prevented. Lung Cancer  You should be screened every year for lung cancer if:  You are a current smoker who has smoked for at least 30 years.  You are a former smoker who has quit within the past 15 years.  Talk to your health care provider about your screening options, when you  should start screening, and how often you should be screened. Colorectal Cancer  Routine colorectal cancer screening usually begins at 64 years of age and should be repeated every 5-10 years until you are 64 years old. You may need to be screened more often if early forms of precancerous polyps or small growths are found. Your health care provider may recommend screening at an earlier age if you have risk factors for colon cancer.  Your health care provider may recommend using home test kits to check for hidden blood in the stool.  A small camera at the end of a tube can be used to examine your colon (sigmoidoscopy or colonoscopy). This checks for the earliest forms of colorectal cancer. Prostate and Testicular Cancer  Depending on your age and overall health, your health care provider may do certain tests to screen for prostate and testicular cancer.  Talk to your health care provider about any symptoms or concerns you have about testicular or prostate cancer. Skin Cancer  Check your skin from head to toe regularly.  Tell your health care provider about any new moles or changes in moles, especially if:  There is a change in a mole's size, shape, or color.  You have a mole that is larger than a pencil eraser.  Always use sunscreen. Apply sunscreen liberally and repeat throughout the day.  Protect yourself by wearing long sleeves, pants, a wide-brimmed hat, and sunglasses when outside. What should I know about  heart disease, diabetes, and high blood pressure?  If you are 21-72 years of age, have your blood pressure checked every 3-5 years. If you are 46 years of age or older, have your blood pressure checked every year. You should have your blood pressure measured twice-once when you are at a hospital or clinic, and once when you are not at a hospital or clinic. Record the average of the two measurements. To check your blood pressure when you are not at a hospital or clinic, you can  use:  An automated blood pressure machine at a pharmacy.  A home blood pressure monitor.  Talk to your health care provider about your target blood pressure.  If you are between 48-56 years old, ask your health care provider if you should take aspirin to prevent heart disease.  Have regular diabetes screenings by checking your fasting blood sugar level.  If you are at a normal weight and have a low risk for diabetes, have this test once every three years after the age of 1.  If you are overweight and have a high risk for diabetes, consider being tested at a younger age or more often.  A one-time screening for abdominal aortic aneurysm (AAA) by ultrasound is recommended for men aged 52-75 years who are current or former smokers. What should I know about preventing infection? Hepatitis B  If you have a higher risk for hepatitis B, you should be screened for this virus. Talk with your health care provider to find out if you are at risk for hepatitis B infection. Hepatitis C  Blood testing is recommended for:  Everyone born from [birth year] through 1963.  Anyone with known risk factors for hepatitis C. Sexually Transmitted Diseases (STDs)  You should be screened each year for STDs including gonorrhea and chlamydia if:  You are sexually active and are younger than 64 years of age.  You are older than 64 years of age and your health care provider tells you that you are at risk for this type of infection.  Your sexual activity has changed since you were last screened and you are at an increased risk for chlamydia or gonorrhea. Ask your health care provider if you are at risk.  Talk with your health care provider about whether you are at high risk of being infected with HIV. Your health care provider may recommend a prescription medicine to help prevent HIV infection. What else can I do?  Schedule regular health, dental, and eye exams.  Stay current with your vaccines  (immunizations).  Do not use any tobacco products, such as cigarettes, chewing tobacco, and e-cigarettes. If you need help quitting, ask your health care provider.  Limit alcohol intake to no more than 2 drinks per day. One drink equals 12 ounces of beer, 5 ounces of wine, or 1 ounces of hard liquor.  Do not use street drugs.  Do not share needles.  Ask your health care provider for help if you need support or information about quitting drugs.  Tell your health care provider if you often feel depressed.  Tell your health care provider if you have ever been abused or do not feel safe at home. This information is not intended to replace advice given to you by your health care provider. Make sure you discuss any questions you have with your health care provider. Document Released: 06/27/2007 Document Revised: 08/28/2015 Document Reviewed: 10/02/2014 Elsevier Interactive Patient Education  2017 [employer].

## 2016-05-26 LAB — HM COLONOSCOPY

## 2016-10-29 ENCOUNTER — Encounter: Payer: Self-pay | Admitting: Family Medicine

## 2016-10-29 ENCOUNTER — Ambulatory Visit (INDEPENDENT_AMBULATORY_CARE_PROVIDER_SITE_OTHER): Payer: BLUE CROSS/BLUE SHIELD | Admitting: Family Medicine

## 2016-10-29 DIAGNOSIS — F5101 Primary insomnia: Secondary | ICD-10-CM

## 2016-10-29 DIAGNOSIS — K432 Incisional hernia without obstruction or gangrene: Secondary | ICD-10-CM | POA: Diagnosis not present

## 2016-10-29 MED ORDER — TRAZODONE HCL 50 MG PO TABS: 25.0000 mg | ORAL_TABLET | Freq: Every evening | ORAL | 11 refills | Status: DC | PRN

## 2016-10-29 NOTE — Progress Notes (Signed)
BP 110/68   Pulse 94   Temp 98.5 F (36.9 C) (Oral)   Resp 14   Wt 205 lb 11.2 oz (93.3 kg)   SpO2 97%   BMI 25.37 kg/m    Subjective:    Patient ID: Brandon Huffman, male    DOB: 1950/03/18, 66 y.o.   MRN: [id number]  HPI: Brandon Huffman is a 66 y.o. male  Chief Complaint  Patient presents with  . Umbilical Hernia    some pain in area    HPI He is here for evaluation of an abdominal hernia, bothering him with pain; left of the umbilicus, left of surgical incision (for prostate cancer); has been there for a while No blood in the stool, no nausea, no vomiting, no trouble with BMs  Notorious insomnia; using benadryl; uses CPAP; concerned about taking it every night; trouble falling asleep; one cup of coffee in the morning; knows to limit electronics in the night  Depression screen [facility] 2/9 10/29/2016 05/08/2016 03/21/2015  Decreased Interest 0 0 -  Down, Depressed, Hopeless 0 0 0  PHQ - 2 Score 0 0 0   Relevant past medical, surgical, family and social history reviewed Past Medical History:  Diagnosis Date  . Adult ADHD    Dr. Rosine Door  . Allergy   . Family hx of colon cancer 05/08/2016  . Hx of malignant neoplasm of prostate 05/08/2016   2012, s/p radical prostatectomy, urologist is Dr. Tresa Endo [facility]  . Hx of malignant neoplasm of prostate 05/08/2016   2012, s/p radical prostatectomy, urologist is Dr. Tresa Endo [facility]  . Insomnia    Past Surgical History:  Procedure Laterality Date  . artificaill sphincter    . COLONOSCOPY  2016  . HERNIA REPAIR    . PROSTATECTOMY  2012   Radical Prostatectomy.  Marland Kitchen VARICOSE VEIN SURGERY Left 1972   Family History  Problem Relation Age of Onset  . Cancer Sister        Colon CA  . Cancer Brother        Prostate CA  . Pneumonia Mother   . Diabetes Brother   . Cancer Sister        adrenal   . Cancer Brother        prostate-remission  . Irritable bowel syndrome Brother    Social History   Social History  . Marital  status: Married    Spouse name: N/A  . Number of children: N/A  . Years of education: N/A   Occupational History  . Not on file.   Social History Main Topics  . Smoking status: Never Smoker  . Smokeless tobacco: Never Used  . Alcohol use 1.2 oz/week    2 Glasses of wine per week  . Drug use: No  . Sexual activity: Yes    Partners: Female   Other Topics Concern  . Not on file   Social History Narrative  . No narrative on file   Interim medical history since last visit reviewed. Allergies and medications reviewed  Review of Systems Per HPI unless specifically indicated above     Objective:    BP 110/68   Pulse 94   Temp 98.5 F (36.9 C) (Oral)   Resp 14   Wt 205 lb 11.2 oz (93.3 kg)   SpO2 97%   BMI 25.37 kg/m   Wt Readings from Last 3 Encounters:  10/29/16 205 lb 11.2 oz (93.3 kg)  05/08/16 199 lb 3.2 oz (90.4 kg)  03/21/15 188 lb 6.4 oz (85.5 kg)    Physical Exam  Constitutional: He appears well-developed and well-nourished. No distress.  HENT:  Right Ear: Tympanic membrane and ear canal normal.  Left Ear: Tympanic membrane and ear canal normal.  Nose: Nose normal.  Mouth/Throat: Oropharynx is clear and moist and mucous membranes are normal.  Scant cerumen inferiorly along right external auditory canal  Eyes: No scleral icterus.  Cardiovascular: Normal rate, regular rhythm and normal heart sounds.   Pulmonary/Chest: Effort normal and breath sounds normal.  Abdominal: Soft. Bowel sounds are normal. He exhibits no distension. There is tenderness. There is no guarding.    Mildly tender left of the midline incision  Musculoskeletal: Normal range of motion. He exhibits no edema.  Neurological: He is alert. He displays no tremor.  Skin: He is not diaphoretic. No pallor.  Two dark brown keratotic lesions on back, one near center, other over left shoulder  Psychiatric: He has a normal mood and affect. His behavior is normal. Judgment and thought content  normal. His mood appears not anxious. He does not exhibit a depressed mood.    Results for orders placed or performed in visit on 06/04/16  HM COLONOSCOPY  Result Value Ref Range   HM Colonoscopy See Report (in chart) See Report (in chart), Patient Reported      Assessment & Plan:   Problem List Items Addressed This Visit      Other   Insomnia    Will start trazodone; good sleep hygiene      Incisional hernia, without obstruction or gangrene    Caution about strangulation; refer to Dr. Jamal Collin      Relevant Orders   Ambulatory referral to General Surgery       Follow up plan: No Follow-up on file.  An after-visit summary was printed and given to the patient at [facility].  Please see the patient instructions which may contain other information and recommendations beyond what is mentioned above in the assessment and plan.  Meds ordered this encounter  Medications  . DISCONTD: diphenhydrAMINE (BENADRYL) 50 MG capsule    Sig: Take 50 mg by mouth at bedtime.  . Flaxseed, Linseed, (FLAX SEED OIL) 1000 MG CAPS    Sig: Take 1,000 mg by mouth daily.  . Turmeric 500 MG CAPS    Sig: Take 500 mg by mouth daily.  . ranitidine (ZANTAC) 150 MG tablet    Sig: Take 150 mg by mouth daily.  . traZODone (DESYREL) 50 MG tablet    Sig: Take 0.5-1 tablets (25-50 mg total) by mouth at bedtime as needed for sleep.    Dispense:  30 tablet    Refill:  11    Orders Placed This Encounter  Procedures  . Ambulatory referral to General Surgery

## 2016-10-29 NOTE — Assessment & Plan Note (Signed)
Will start trazodone; good sleep hygiene

## 2016-10-29 NOTE — Patient Instructions (Addendum)
Hernia, Adult A hernia is the bulging of an organ or tissue through a weak spot in the muscles of the abdomen (abdominal wall). Hernias develop most often near the navel or groin. There are many kinds of hernias. Common kinds include:  Femoral hernia. This kind of hernia develops under the groin in the upper thigh area.  Inguinal hernia. This kind of hernia develops in the groin or scrotum.  Umbilical hernia. This kind of hernia develops near the navel.  Hiatal hernia. This kind of hernia causes part of the stomach to be pushed up into the chest.  Incisional hernia. This kind of hernia bulges through a scar from an abdominal surgery.  What are the causes? This condition may be caused by:  Heavy lifting.  Coughing over a long period of time.  Straining to have a bowel movement.  An incision made during an abdominal surgery.  A birth defect (congenital defect).  Excess weight or obesity.  Smoking.  Poor nutrition.  Cystic fibrosis.  Excess fluid in the abdomen.  Undescended testicles.  What are the signs or symptoms? Symptoms of a hernia include:  A lump on the abdomen. This is the first sign of a hernia. The lump may become more obvious with standing, straining, or coughing. It may get bigger over time if it is not treated or if the condition causing it is not treated.  Pain. A hernia is usually painless, but it may become painful over time if treatment is delayed. The pain is usually dull and may get worse with standing or lifting heavy objects.  Sometimes a hernia gets tightly squeezed in the weak spot (strangulated) or stuck there (incarcerated) and causes additional symptoms. These symptoms may include:  Vomiting.  Nausea.  Constipation.  Irritability.  How is this diagnosed? A hernia may be diagnosed with:  A physical exam. During the exam your health care provider may ask you to cough or to make a specific movement, because a hernia is usually more  visible when you move.  Imaging tests. These can include: ? X-rays. ? Ultrasound. ? CT scan.  How is this treated? A hernia that is small and painless may not need to be treated. A hernia that is large or painful may be treated with surgery. Inguinal hernias may be treated with surgery to prevent incarceration or strangulation. Strangulated hernias are always treated with surgery, because lack of blood to the trapped organ or tissue can cause it to die. Surgery to treat a hernia involves pushing the bulge back into place and repairing the weak part of the abdomen. Follow these instructions at home:  Avoid straining.  Do not lift anything heavier than 10 lb (4.5 kg).  Lift with your leg muscles, not your back muscles. This helps avoid strain.  When coughing, try to cough gently.  Prevent constipation. Constipation leads to straining with bowel movements, which can make a hernia worse or cause a hernia repair to break down. You can prevent constipation by: ? Eating a high-fiber diet that includes plenty of fruits and vegetables. ? Drinking enough fluids to keep your urine clear or pale yellow. Aim to drink 6-8 glasses of water per day. ? Using a stool softener as directed by your health care provider.  Lose weight, if you are overweight.  Do not use any tobacco products, including cigarettes, chewing tobacco, or electronic cigarettes. If you need help quitting, ask your health care provider.  Keep all follow-up visits as directed by your health care   provider. This is important. Your health care provider may need to monitor your condition. Contact a health care provider if:  You have swelling, redness, and pain in the affected area.  Your bowel habits change. Get help right away if:  You have a fever.  You have abdominal pain that is getting worse.  You feel nauseous or you vomit.  You cannot push the hernia back in place by gently pressing on it while you are lying  down.  The hernia: ? Changes in shape or size. ? Is stuck outside the abdomen. ? Becomes discolored. ? Feels hard or tender. This information is not intended to replace advice given to you by your health care provider. Make sure you discuss any questions you have with your health care provider. Document Released: 12/29/2004 Document Revised: 05/29/2015 Document Reviewed: 11/08/2013 Elsevier Interactive Patient Education  2017 [employer]. Insomnia Insomnia is a sleep disorder that makes it difficult to fall asleep or to stay asleep. Insomnia can cause tiredness (fatigue), low energy, difficulty concentrating, mood swings, and poor performance at work or school. There are three different ways to classify insomnia:  Difficulty falling asleep.  Difficulty staying asleep.  Waking up too early in the morning.  Any type of insomnia can be long-term (chronic) or short-term (acute). Both are common. Short-term insomnia usually lasts for three months or less. Chronic insomnia occurs at least three times a week for longer than three months. What are the causes? Insomnia may be caused by another condition, situation, or substance, such as:  Anxiety.  Certain medicines.  Gastroesophageal reflux disease (GERD) or other gastrointestinal conditions.  Asthma or other breathing conditions.  Restless legs syndrome, sleep apnea, or other sleep disorders.  Chronic pain.  Menopause. This may include hot flashes.  Stroke.  Abuse of alcohol, tobacco, or illegal drugs.  Depression.  Caffeine.  Neurological disorders, such as Alzheimer disease.  An overactive thyroid (hyperthyroidism).  The cause of insomnia may not be known. What increases the risk? Risk factors for insomnia include:  Gender. Women are more commonly affected than men.  Age. Insomnia is more common as you get older.  Stress. This may involve your professional or personal life.  Income. Insomnia is more common in  people with lower income.  Lack of exercise.  Irregular work schedule or night shifts.  Traveling between different time zones.  What are the signs or symptoms? If you have insomnia, trouble falling asleep or trouble staying asleep is the main symptom. This may lead to other symptoms, such as:  Feeling fatigued.  Feeling nervous about going to sleep.  Not feeling rested in the morning.  Having trouble concentrating.  Feeling irritable, anxious, or depressed.  How is this treated? Treatment for insomnia depends on the cause. If your insomnia is caused by an underlying condition, treatment will focus on addressing the condition. Treatment may also include:  Medicines to help you sleep.  Counseling or therapy.  Lifestyle adjustments.  Follow these instructions at home:  Take medicines only as directed by your health care provider.  Keep regular sleeping and waking hours. Avoid naps.  Keep a sleep diary to help you and your health care provider figure out what could be causing your insomnia. Include: ? When you sleep. ? When you wake up during the night. ? How well you sleep. ? How rested you feel the next day. ? Any side effects of medicines you are taking. ? What you eat and drink.  Make your bedroom  a comfortable place where it is easy to fall asleep: ? Put up shades or special blackout curtains to block light from outside. ? Use a white noise machine to block noise. ? Keep the temperature cool.  Exercise regularly as directed by your health care provider. Avoid exercising right before bedtime.  Use relaxation techniques to manage stress. Ask your health care provider to suggest some techniques that may work well for you. These may include: ? Breathing exercises. ? Routines to release muscle tension. ? Visualizing peaceful scenes.  Cut back on alcohol, caffeinated beverages, and cigarettes, especially close to bedtime. These can disrupt your sleep.  Do not  overeat or eat spicy foods right before bedtime. This can lead to digestive discomfort that can make it hard for you to sleep.  Limit screen use before bedtime. This includes: ? Watching TV. ? Using your smartphone, tablet, and computer.  Stick to a routine. This can help you fall asleep faster. Try to do a quiet activity, brush your teeth, and go to bed at the same time each night.  Get out of bed if you are still awake after 15 minutes of trying to sleep. Keep the lights down, but try reading or doing a quiet activity. When you feel sleepy, go back to bed.  Make sure that you drive carefully. Avoid driving if you feel very sleepy.  Keep all follow-up appointments as directed by your health care provider. This is important. Contact a health care provider if:  You are tired throughout the day or have trouble in your daily routine due to sleepiness.  You continue to have sleep problems or your sleep problems get worse. Get help right away if:  You have serious thoughts about hurting yourself or someone else. This information is not intended to replace advice given to you by your health care provider. Make sure you discuss any questions you have with your health care provider. Document Released: 12/27/1999 Document Revised: 05/31/2015 Document Reviewed: 09/29/2013 Elsevier Interactive Patient Education  [employer].

## 2016-10-29 NOTE — Assessment & Plan Note (Signed)
Caution about strangulation; refer to Dr. Jamal Collin

## 2016-10-30 ENCOUNTER — Encounter: Payer: Self-pay | Admitting: *Deleted

## 2016-11-02 ENCOUNTER — Ambulatory Visit (INDEPENDENT_AMBULATORY_CARE_PROVIDER_SITE_OTHER): Payer: BLUE CROSS/BLUE SHIELD | Admitting: General Surgery

## 2016-11-02 ENCOUNTER — Encounter: Payer: Self-pay | Admitting: General Surgery

## 2016-11-02 VITALS — BP 124/70 | HR 70 | Resp 14 | Ht 75.0 in | Wt 209.0 lb

## 2016-11-02 DIAGNOSIS — K439 Ventral hernia without obstruction or gangrene: Secondary | ICD-10-CM

## 2016-11-02 NOTE — Progress Notes (Signed)
Patient ID: UNIQUE Brandon Huffman, male   DOB: February 22, 1950, 66 y.o.   MRN: [id number]  Chief Complaint  Patient presents with  . Other    HPI Brandon Huffman is a 66 y.o. male here today for a evaluation of incisional hernia. Patient states he noticed this about five years ago. In the last year the area has got bigger, he notices it most with physical activity. Reports some pain with activity.  HPI  Past Medical History:  Diagnosis Date  . Adult ADHD    Dr. Rosine Door  . Allergy   . Family hx of colon cancer 05/08/2016  . Hx of malignant neoplasm of prostate 05/08/2016   2012, s/p radical prostatectomy, urologist is Dr. Tresa Endo [facility]  . Hx of malignant neoplasm of prostate 05/08/2016   2012, s/p radical prostatectomy, urologist is Dr. Tresa Endo [facility]  . Insomnia   . Varicose vein of leg     Past Surgical History:  Procedure Laterality Date  . artificaill sphincter    . COLONOSCOPY  2016  . HERNIA REPAIR  8413   umbilical   . PROSTATECTOMY  2012   Radical Prostatectomy.  Brandon Huffman VARICOSE VEIN SURGERY Left 1972    Family History  Problem Relation Age of Onset  . Cancer Sister        Colon CA  . Cancer Brother        Prostate CA  . Pneumonia Mother   . Diabetes Brother   . Cancer Sister        adrenal   . Cancer Brother        prostate-remission  . Irritable bowel syndrome Brother     Social History Social History  Substance Use Topics  . Smoking status: Never Smoker  . Smokeless tobacco: Never Used  . Alcohol use 1.2 oz/week    2 Glasses of wine per week    Allergies  Allergen Reactions  . Chocolate Flavor Other (See Comments)    Cysts under the skin.  . Tetracyclines & Related Hives and Rash    Current Outpatient Prescriptions  Medication Sig Dispense Refill  . aspirin 81 MG tablet Chew 81 mg by mouth.    . Flaxseed, Linseed, (FLAX SEED OIL) 1000 MG CAPS Take 1,000 mg by mouth daily.    . fluticasone (FLONASE) 50 MCG/ACT nasal spray Place 1 spray into both  nostrils as needed. Reported on 03/21/2015    . gabapentin (NEURONTIN) 300 MG capsule Take 300 mg by mouth at bedtime. Reported on 03/21/2015    . Humidifier MISC     . Melatonin 3 MG TABS Take 3 mg by mouth at bedtime. Reported on 03/21/2015    . Multiple Vitamins-Minerals (MULTIVITAMIN WITH MINERALS) tablet Take by mouth.    . oxybutynin (DITROPAN-XL) 10 MG 24 hr tablet Take 10 mg by mouth as needed.    . ranitidine (ZANTAC) 150 MG tablet Take 150 mg by mouth daily.    . traZODone (DESYREL) 50 MG tablet Take 0.5-1 tablets (25-50 mg total) by mouth at bedtime as needed for sleep. 30 tablet 11  . Turmeric 500 MG CAPS Take 500 mg by mouth daily.     No current facility-administered medications for this visit.     Review of Systems Review of Systems  Constitutional: Negative.   Respiratory: Negative.   Cardiovascular: Negative.   Gastrointestinal: Negative.     Blood pressure 124/70, pulse 70, resp. rate 14, height 6\' 3"  (1.905 m), weight 209 lb (94.8 kg).  Physical Exam Physical Exam  Constitutional: He is oriented to person, place, and time. He appears well-developed and well-nourished.  Eyes: Conjunctivae are normal. No scleral icterus.  Neck: Neck supple.  Cardiovascular: Normal rate, regular rhythm and normal heart sounds.   Pulmonary/Chest: Effort normal and breath sounds normal.  Abdominal: Soft. Normal appearance and bowel sounds are normal. There is no tenderness. No hernia. Hernia confirmed negative in the ventral area.  Pt examined supine and standing for ventral hernia, none detected.   Lymphadenopathy:    He has no cervical adenopathy.  Neurological: He is alert and oriented to person, place, and time.  Skin: Skin is warm.    Data Reviewed Notes reviewed   Assessment  Possible abdominal wall hernia based on patients history and presentation despite negative exam.  Plan    Patient to have non-contrast CT scan done to evaluate for possible hernia. The patient is  aware to call back for any questions or concerns.     HPI, Physical Exam, Assessment and Plan have been scribed under the direction and in the presence of Mckinley Jewel, MD  Gaspar Cola, CMA  I have completed the exam and reviewed the above documentation for accuracy and completeness.  I agree with the above.  Haematologist has been used and any errors in dictation or transcription are unintentional.  Seeplaputhur G. Jamal Collin, M.D., F.A.C.S.    Junie Panning G 11/02/2016, 1:10 PM  Patient has been scheduled for a CT abdomen without contrast (no oral or IV contrast per Dr. Jamal Collin) at [place] for 11-13-16 at 8 am (arrive 7:45 am). Prep: none per Scheduling Department. Patient verbalizes understanding.  He will be contacted once results are available.   Dominga Ferry, CMA

## 2016-11-02 NOTE — Patient Instructions (Signed)
Patient to have ct scan done . The patient is aware to call back for any questions or concerns.

## 2016-11-11 ENCOUNTER — Encounter: Payer: Self-pay | Admitting: General Surgery

## 2016-11-13 ENCOUNTER — Ambulatory Visit: Payer: Self-pay

## 2016-11-16 ENCOUNTER — Ambulatory Visit (INDEPENDENT_AMBULATORY_CARE_PROVIDER_SITE_OTHER): Payer: BLUE CROSS/BLUE SHIELD | Admitting: General Surgery

## 2016-11-16 ENCOUNTER — Encounter: Payer: Self-pay | Admitting: General Surgery

## 2016-11-16 VITALS — BP 128/74 | HR 74 | Resp 12 | Ht 75.0 in | Wt 211.0 lb

## 2016-11-16 DIAGNOSIS — K439 Ventral hernia without obstruction or gangrene: Secondary | ICD-10-CM | POA: Diagnosis not present

## 2016-11-16 NOTE — Progress Notes (Signed)
Patient ID: CID AGENA, male   DOB: 1950/07/18, 66 y.o.   MRN: [id number]  Chief Complaint  Patient presents with  . Follow-up    hernia    HPI Brandon Huffman is a 66 y.o. male here today to discuss his CT scan done on 11/06/2016 to evaluate for possible hernia. Patient states the area has not been bothering him as much lately.   HPI  Past Medical History:  Diagnosis Date  . Adult ADHD    Dr. Rosine Door  . Allergy   . Family hx of colon cancer 05/08/2016  . Hx of malignant neoplasm of prostate 05/08/2016   2012, s/p radical prostatectomy, urologist is Dr. Tresa Endo [facility]  . Hx of malignant neoplasm of prostate 05/08/2016   2012, s/p radical prostatectomy, urologist is Dr. Tresa Endo [facility]  . Insomnia   . Varicose vein of leg     Past Surgical History:  Procedure Laterality Date  . artificaill sphincter    . COLONOSCOPY  2016  . HERNIA REPAIR  [id number]   umbilical   . PROSTATECTOMY  2012   Radical Prostatectomy.  Marland Kitchen VARICOSE VEIN SURGERY Left 1972    Family History  Problem Relation Age of Onset  . Cancer Sister        Colon CA  . Cancer Brother        Prostate CA  . Pneumonia Mother   . Diabetes Brother   . Cancer Sister        adrenal   . Cancer Brother        prostate-remission  . Irritable bowel syndrome Brother     Social History Social History   Tobacco Use  . Smoking status: Never Smoker  . Smokeless tobacco: Never Used  Substance Use Topics  . Alcohol use: Yes    Alcohol/week: 1.2 oz    Types: 2 Glasses of wine per week  . Drug use: No    Allergies  Allergen Reactions  . Chocolate Flavor Other (See Comments)    Cysts under the skin.  . Tetracyclines & Related Hives and Rash    Current Outpatient Medications  Medication Sig Dispense Refill  . aspirin 81 MG tablet Chew 81 mg by mouth.    . Flaxseed, Linseed, (FLAX SEED OIL) 1000 MG CAPS Take 1,000 mg by mouth daily.    . fluticasone (FLONASE) 50 MCG/ACT nasal spray Place 1 spray into both  nostrils as needed. Reported on 03/21/2015    . gabapentin (NEURONTIN) 300 MG capsule Take 300 mg by mouth at bedtime. Reported on 03/21/2015    . Humidifier MISC     . Melatonin 3 MG TABS Take 3 mg by mouth at bedtime. Reported on 03/21/2015    . Multiple Vitamins-Minerals (MULTIVITAMIN WITH MINERALS) tablet Take by mouth.    . oxybutynin (DITROPAN-XL) 10 MG 24 hr tablet Take 10 mg by mouth as needed.    . ranitidine (ZANTAC) 150 MG tablet Take 150 mg by mouth daily.    . traZODone (DESYREL) 50 MG tablet Take 0.5-1 tablets (25-50 mg total) by mouth at bedtime as needed for sleep. 30 tablet 11  . Turmeric 500 MG CAPS Take 500 mg by mouth daily.     No current facility-administered medications for this visit.     Review of Systems Review of Systems  Constitutional: Negative.   Respiratory: Negative.   Cardiovascular: Negative.     Blood pressure 128/74, pulse 74, resp. rate 12, height 6\' 3"  (1.905 m),  weight 211 lb (95.7 kg).  Physical Exam Physical Exam  Constitutional: He is oriented to person, place, and time. He appears well-developed and well-nourished.  Cardiovascular: Normal rate, regular rhythm and normal heart sounds.  Pulmonary/Chest: Effort normal and breath sounds normal.  Abdominal: Soft. Normal appearance and bowel sounds are normal. There is no tenderness. A hernia is present. Hernia confirmed positive in the ventral area.    Neurological: He is alert and oriented to person, place, and time.  Skin: Skin is warm and dry.    Data Reviewed Prior notes, CT scan reviewed CT scan report did not note a hernia. After reviewing the CT scan, a small 1cm fascial defect with fat filled ventral hernia was detected superior to the umbilicus and slightly left of midline. No bowel was visualized projecting through the area.   Assessment    Abdominal wall hernia- small reducible ventral hernia superior to the umbilicus and slightly left of midline noted on physical exam and CT. Pt is  intermittently symptomatic and wishes to have it repaired.     Plan      Hernia precautions and incarceration were discussed with the patient. If they develop symptoms of an incarcerated hernia, they were encouraged to seek prompt medical attention.  I have recommended repair of the hernia on an outpatient basis in the near future. The risk of infection was reviewed.   HPI, Physical Exam, Assessment and Plan have been scribed under the direction and in the presence of Mckinley Jewel, MD  Gaspar Cola, CMA        Patient's surgery has been scheduled for 12-11-16 at [facility]. It is okay for patient to continue an 81 mg aspirin once daily.    Dominga Ferry, CMA   I have completed the exam and reviewed the above documentation for accuracy and completeness.  I agree with the above.  Haematologist has been used and any errors in dictation or transcription are unintentional.  Zaylah Blecha G. Jamal Collin, M.D., F.A.C.S.  Junie Panning G 11/16/2016, 10:56 AM

## 2016-11-16 NOTE — Progress Notes (Deleted)
Patient ID: Brandon Huffman, male   DOB: 03-16-1950, 66 y.o.   MRN: [id number]  Chief Complaint  Patient presents with  . Follow-up    hernia    HPI Brandon Huffman is a 66 y.o. male  HPI  Past Medical History:  Diagnosis Date  . Adult ADHD    Dr. Rosine Door  . Allergy   . Family hx of colon cancer 05/08/2016  . Hx of malignant neoplasm of prostate 05/08/2016   2012, s/p radical prostatectomy, urologist is Dr. Tresa Endo [facility]  . Hx of malignant neoplasm of prostate 05/08/2016   2012, s/p radical prostatectomy, urologist is Dr. Tresa End[facility]  . Insomnia   . Varicose vein of leg     Past Surgical History:  Procedure Laterality Date  . artificaill sphincter    . COLONOSCOPY  2016  . HERNIA REPAIR  7893   umbilical   . PROSTATECTOMY  2012   Radical Prostatectomy.  Marland Kitchen VARICOSE VEIN SURGERY Left 1972    Family History  Problem Relation Age of Onset  . Cancer Sister        Colon CA  . Cancer Brother        Prostate CA  . Pneumonia Mother   . Diabetes Brother   . Cancer Sister        adrenal   . Cancer Brother        prostate-remission  . Irritable bowel syndrome Brother     Social History Social History   Tobacco Use  . Smoking status: Never Smoker  . Smokeless tobacco: Never Used  Substance Use Topics  . Alcohol use: Yes    Alcohol/week: 1.2 oz    Types: 2 Glasses of wine per week  . Drug use: No    Allergies  Allergen Reactions  . Chocolate Flavor Other (See Comments)    Cysts under the skin.  . Tetracyclines & Related Hives and Rash    Current Outpatient Medications  Medication Sig Dispense Refill  . aspirin 81 MG tablet Chew 81 mg by mouth.    . Flaxseed, Linseed, (FLAX SEED OIL) 1000 MG CAPS Take 1,000 mg by mouth daily.    . fluticasone (FLONASE) 50 MCG/ACT nasal spray Place 1 spray into both nostrils as needed. Reported on 03/21/2015    . gabapentin (NEURONTIN) 300 MG capsule Take 300 mg by mouth at bedtime. Reported on 03/21/2015    . Humidifier  MISC     . Melatonin 3 MG TABS Take 3 mg by mouth at bedtime. Reported on 03/21/2015    . Multiple Vitamins-Minerals (MULTIVITAMIN WITH MINERALS) tablet Take by mouth.    . oxybutynin (DITROPAN-XL) 10 MG 24 hr tablet Take 10 mg by mouth as needed.    . ranitidine (ZANTAC) 150 MG tablet Take 150 mg by mouth daily.    . traZODone (DESYREL) 50 MG tablet Take 0.5-1 tablets (25-50 mg total) by mouth at bedtime as needed for sleep. 30 tablet 11  . Turmeric 500 MG CAPS Take 500 mg by mouth daily.     No current facility-administered medications for this visit.     Review of Systems Review of Systems  Blood pressure 128/74, pulse 74, resp. rate 12, height 6\' 3"  (1.905 m), weight 211 lb (95.7 kg).  Physical Exam Physical Exam  Data Reviewed ***  Assessment    ***    Plan    ***       Lesly Rubenstein 11/16/2016, 2:34 PM

## 2016-11-16 NOTE — H&P (View-Only) (Signed)
Patient ID: Brandon Huffman, male   DOB: [birth year], 66 y.o.   MRN: [id number]  Chief Complaint  Patient presents with  . Follow-up    hernia    HPI Brandon Huffman is a 66 y.o. male here today to discuss his CT scan done on 11/06/2016 to evaluate for possible hernia. Patient states the area has not been bothering him as much lately.   HPI  Past Medical History:  Diagnosis Date  . Adult ADHD    Dr. Rosine Door  . Allergy   . Family hx of colon cancer 05/08/2016  . Hx of malignant neoplasm of prostate 05/08/2016   2012, s/p radical prostatectomy, urologist is Dr. Tresa Endo [facility]  . Hx of malignant neoplasm of prostate 05/08/2016   2012, s/p radical prostatectomy, urologist is Dr. Tresa Endo [facility]  . Insomnia   . Varicose vein of leg     Past Surgical History:  Procedure Laterality Date  . artificaill sphincter    . COLONOSCOPY  2016  . HERNIA REPAIR  8786   umbilical   . PROSTATECTOMY  2012   Radical Prostatectomy.  Marland Kitchen VARICOSE VEIN SURGERY Left 1972    Family History  Problem Relation Age of Onset  . Cancer Sister        Colon CA  . Cancer Brother        Prostate CA  . Pneumonia Mother   . Diabetes Brother   . Cancer Sister        adrenal   . Cancer Brother        prostate-remission  . Irritable bowel syndrome Brother     Social History Social History   Tobacco Use  . Smoking status: Never Smoker  . Smokeless tobacco: Never Used  Substance Use Topics  . Alcohol use: Yes    Alcohol/week: 1.2 oz    Types: 2 Glasses of wine per week  . Drug use: No    Allergies  Allergen Reactions  . Chocolate Flavor Other (See Comments)    Cysts under the skin.  . Tetracyclines & Related Hives and Rash    Current Outpatient Medications  Medication Sig Dispense Refill  . aspirin 81 MG tablet Chew 81 mg by mouth.    . Flaxseed, Linseed, (FLAX SEED OIL) 1000 MG CAPS Take 1,000 mg by mouth daily.    . fluticasone (FLONASE) 50 MCG/ACT nasal spray Place 1 spray into both  nostrils as needed. Reported on 03/21/2015    . gabapentin (NEURONTIN) 300 MG capsule Take 300 mg by mouth at bedtime. Reported on 03/21/2015    . Humidifier MISC     . Melatonin 3 MG TABS Take 3 mg by mouth at bedtime. Reported on 03/21/2015    . Multiple Vitamins-Minerals (MULTIVITAMIN WITH MINERALS) tablet Take by mouth.    . oxybutynin (DITROPAN-XL) 10 MG 24 hr tablet Take 10 mg by mouth as needed.    . ranitidine (ZANTAC) 150 MG tablet Take 150 mg by mouth daily.    . traZODone (DESYREL) 50 MG tablet Take 0.5-1 tablets (25-50 mg total) by mouth at bedtime as needed for sleep. 30 tablet 11  . Turmeric 500 MG CAPS Take 500 mg by mouth daily.     No current facility-administered medications for this visit.     Review of Systems Review of Systems  Constitutional: Negative.   Respiratory: Negative.   Cardiovascular: Negative.     Blood pressure 128/74, pulse 74, resp. rate 12, height 6\' 3"  (1.905 m),  weight 211 lb (95.7 kg).  Physical Exam Physical Exam  Constitutional: He is oriented to person, place, and time. He appears well-developed and well-nourished.  Cardiovascular: Normal rate, regular rhythm and normal heart sounds.  Pulmonary/Chest: Effort normal and breath sounds normal.  Abdominal: Soft. Normal appearance and bowel sounds are normal. There is no tenderness. A hernia is present. Hernia confirmed positive in the ventral area.    Neurological: He is alert and oriented to person, place, and time.  Skin: Skin is warm and dry.    Data Reviewed Prior notes, CT scan reviewed CT scan report did not note a hernia. After reviewing the CT scan, a small 1cm fascial defect with fat filled ventral hernia was detected superior to the umbilicus and slightly left of midline. No bowel was visualized projecting through the area.   Assessment    Abdominal wall hernia- small reducible ventral hernia superior to the umbilicus and slightly left of midline noted on physical exam and CT. Pt is  intermittently symptomatic and wishes to have it repaired.     Plan      Hernia precautions and incarceration were discussed with the patient. If they develop symptoms of an incarcerated hernia, they were encouraged to seek prompt medical attention.  I have recommended repair of the hernia on an outpatient basis in the near future. The risk of infection was reviewed.   HPI, Physical Exam, Assessment and Plan have been scribed under the direction and in the presence of Mckinley Jewel, MD  Gaspar Cola, CMA        Patient's surgery has been scheduled for 12-11-16 at [facility]. It is okay for patient to continue an 81 mg aspirin once daily.    Dominga Ferry, CMA   I have completed the exam and reviewed the above documentation for accuracy and completeness.  I agree with the above.  Haematologist has been used and any errors in dictation or transcription are unintentional.  Seeplaputhur G. Jamal Collin, M.D., F.A.C.S.  Junie Panning G 11/16/2016, 10:56 AM

## 2016-11-16 NOTE — Patient Instructions (Signed)

## 2016-11-20 ENCOUNTER — Other Ambulatory Visit: Payer: Self-pay | Admitting: General Surgery

## 2016-11-20 DIAGNOSIS — K439 Ventral hernia without obstruction or gangrene: Secondary | ICD-10-CM

## 2016-12-02 ENCOUNTER — Other Ambulatory Visit: Payer: Self-pay

## 2016-12-02 ENCOUNTER — Encounter
Admission: RE | Admit: 2016-12-02 | Discharge: 2016-12-02 | Disposition: A | Discharge status: Home / Self Care | Payer: PRIVATE HEALTH INSURANCE | Source: Ambulatory Visit | Attending: General Surgery | Admitting: General Surgery

## 2016-12-02 ENCOUNTER — Telehealth: Payer: Self-pay | Admitting: *Deleted

## 2016-12-02 DIAGNOSIS — Z9889 Other specified postprocedural states: Secondary | ICD-10-CM | POA: Diagnosis not present

## 2016-12-02 DIAGNOSIS — Z8042 Family history of malignant neoplasm of prostate: Secondary | ICD-10-CM | POA: Insufficient documentation

## 2016-12-02 DIAGNOSIS — Z808 Family history of malignant neoplasm of other organs or systems: Secondary | ICD-10-CM | POA: Diagnosis not present

## 2016-12-02 DIAGNOSIS — G4700 Insomnia, unspecified: Secondary | ICD-10-CM | POA: Insufficient documentation

## 2016-12-02 DIAGNOSIS — Z79899 Other long term (current) drug therapy: Secondary | ICD-10-CM | POA: Insufficient documentation

## 2016-12-02 DIAGNOSIS — F909 Attention-deficit hyperactivity disorder, unspecified type: Secondary | ICD-10-CM | POA: Insufficient documentation

## 2016-12-02 DIAGNOSIS — Z01818 Encounter for other preprocedural examination: Secondary | ICD-10-CM | POA: Diagnosis not present

## 2016-12-02 DIAGNOSIS — Z888 Allergy status to other drugs, medicaments and biological substances status: Secondary | ICD-10-CM | POA: Diagnosis not present

## 2016-12-02 DIAGNOSIS — Z833 Family history of diabetes mellitus: Secondary | ICD-10-CM | POA: Insufficient documentation

## 2016-12-02 DIAGNOSIS — I8390 Asymptomatic varicose veins of unspecified lower extremity: Secondary | ICD-10-CM | POA: Diagnosis not present

## 2016-12-02 DIAGNOSIS — Z91012 Allergy to eggs: Secondary | ICD-10-CM | POA: Insufficient documentation

## 2016-12-02 DIAGNOSIS — K439 Ventral hernia without obstruction or gangrene: Secondary | ICD-10-CM | POA: Insufficient documentation

## 2016-12-02 DIAGNOSIS — Z800 Family history of malignant neoplasm of digestive organs: Secondary | ICD-10-CM | POA: Diagnosis not present

## 2016-12-02 DIAGNOSIS — Z7982 Long term (current) use of aspirin: Secondary | ICD-10-CM | POA: Diagnosis not present

## 2016-12-02 HISTORY — DX: Malignant (primary) neoplasm, unspecified: C80.1

## 2016-12-02 HISTORY — DX: Sleep apnea, unspecified: G47.30

## 2016-12-02 HISTORY — DX: Gastro-esophageal reflux disease without esophagitis: K21.9

## 2016-12-02 NOTE — Telephone Encounter (Signed)
Caryl Pina from pre admit called regarding [name]. He got upset with her when she called him regarding his pre admit phone call. The phone call was unsuccessful because he thought his phone interview was at 11:15am today. He didn't know that it was a time frame. A nurse from pre admit will call him again to try to do his phone interview if that is unsuccessful he will need to have another office visit with Dr.Sankar

## 2016-12-02 NOTE — Patient Instructions (Signed)
Your procedure is scheduled on: 12-11-16 Report to Same Day Surgery 2nd floor medical mall [facility] Entrance-take elevator on left to 2nd floor.  Check in with surgery information desk.) To find out your arrival time please call [phone] between 1PM - 3PM on 12-10-16  Remember: Instructions that are not followed completely may result in serious medical risk, up to and including death, or upon the discretion of your surgeon and anesthesiologist your surgery may need to be rescheduled.    _x___ 1. Do not eat food after midnight the night before your procedure. NO GUM CHEWING OR CANDY AFTER MIDNIGHT. You may drink clear liquids up to 2 hours before you are scheduled to arrive at the hospital for your procedure.  Do not drink clear liquids within 2 hours of your scheduled arrival to the hospital.  Clear liquids include  --Water or Apple juice without pulp  --Clear carbohydrate beverage such as ClearFast or Gatorade  --Black Coffee or Clear Tea (No milk, no creamers, do not add anything to the coffee or Tea     __x__ 2. No Alcohol for 24 hours before or after surgery.   __x__3. No Smoking for 24 prior to surgery.   ____  4. Bring all medications with you on the day of surgery if instructed.    __x__ 5. Notify your doctor if there is any change in your medical condition     (cold, fever, infections).     Do not wear jewelry, make-up, hairpins, clips or nail polish.  Do not wear lotions, powders, or perfumes. You may wear deodorant.  Do not shave 48 hours prior to surgery. Men may shave face and neck.  Do not bring valuables to the hospital.    [facility] is not responsible for any belongings or valuables.               Contacts, dentures or bridgework may not be worn into surgery.  Leave your suitcase in the car. After surgery it may be brought to your room.  For patients admitted to the hospital, discharge time is determined by your treatment team.   Patients discharged the  day of surgery will not be allowed to drive home.  You will need someone to drive you home and stay with you the night of your procedure.      _x___ TAKE THE FOLLOWING MEDICATION THE MORNING OF SURGERY WITH A SMALL SIP OF WATER. These include:  1. RANITIDINE  2. TAKE AN EXTRA RANITIDINE Thursday NIGHT BEFORE BED  3.  4.  5.  6.  ____Fleets enema or Magnesium Citrate as directed.   ____ Use CHG Soap or sage wipes as directed on instruction sheet   ____ Use inhalers on the day of surgery and bring to hospital day of surgery  ____ Stop Metformin and Janumet 2 days prior to surgery.    ____ Take 1/2 of usual insulin dose the night before surgery and none on the morning surgery.   _X___ Follow recommendations from Cardiologist, Pulmonologist or PCP regarding stopping Aspirin, Coumadin, Plavix ,Eliquis, Effient, or Pradaxa, and Pletal-OK TO CONTINUE 81 MG ASPIRIN-DO NOT TAKE AM OF SURGERY  ____Stop Anti-inflammatories such as Advil, Aleve, Ibuprofen, Motrin, Naproxen, Naprosyn, Goodies powders or aspirin products. OK to take Tylenol    _x___ Stop supplements until after surgery-STOP PRIVIGEN, TURMERIC, MELATONIN AND FLAX SEED OIL NOW-PT RAN OUT OF GRAPE SEED EXTRACT LAST WEEK AND HAS BEEN INSTRUCTED TO WAIT AND START IT BACK AFTER SURGERY  ____ Bring C-Pap to the hospital.

## 2016-12-10 ENCOUNTER — Encounter: Payer: Self-pay | Admitting: *Deleted

## 2016-12-10 MED ORDER — CEFAZOLIN SODIUM-DEXTROSE 2-4 GM/100ML-% IV SOLN
2.0000 g | INTRAVENOUS | Status: AC
  Administered 2016-12-11: 2 g via INTRAVENOUS

## 2016-12-11 ENCOUNTER — Encounter
Admission: RE | Disposition: A | Discharge status: Home / Self Care | Payer: Self-pay | Source: Ambulatory Visit | Attending: General Surgery

## 2016-12-11 ENCOUNTER — Ambulatory Visit: Payer: BLUE CROSS/BLUE SHIELD | Admitting: Anesthesiology

## 2016-12-11 ENCOUNTER — Encounter: Payer: Self-pay | Admitting: *Deleted

## 2016-12-11 ENCOUNTER — Ambulatory Visit
Admission: RE | Admit: 2016-12-11 | Discharge: 2016-12-11 | Disposition: A | Discharge status: Home / Self Care | Payer: BLUE CROSS/BLUE SHIELD | Source: Ambulatory Visit | Attending: General Surgery | Admitting: General Surgery

## 2016-12-11 DIAGNOSIS — Z7982 Long term (current) use of aspirin: Secondary | ICD-10-CM | POA: Insufficient documentation

## 2016-12-11 DIAGNOSIS — Z9989 Dependence on other enabling machines and devices: Secondary | ICD-10-CM | POA: Diagnosis not present

## 2016-12-11 DIAGNOSIS — K439 Ventral hernia without obstruction or gangrene: Secondary | ICD-10-CM

## 2016-12-11 DIAGNOSIS — F909 Attention-deficit hyperactivity disorder, unspecified type: Secondary | ICD-10-CM | POA: Insufficient documentation

## 2016-12-11 DIAGNOSIS — G4730 Sleep apnea, unspecified: Secondary | ICD-10-CM | POA: Insufficient documentation

## 2016-12-11 DIAGNOSIS — Z91018 Allergy to other foods: Secondary | ICD-10-CM | POA: Insufficient documentation

## 2016-12-11 DIAGNOSIS — K429 Umbilical hernia without obstruction or gangrene: Secondary | ICD-10-CM | POA: Insufficient documentation

## 2016-12-11 DIAGNOSIS — I739 Peripheral vascular disease, unspecified: Secondary | ICD-10-CM | POA: Insufficient documentation

## 2016-12-11 DIAGNOSIS — Z881 Allergy status to other antibiotic agents status: Secondary | ICD-10-CM | POA: Diagnosis not present

## 2016-12-11 DIAGNOSIS — G4700 Insomnia, unspecified: Secondary | ICD-10-CM | POA: Diagnosis not present

## 2016-12-11 DIAGNOSIS — Z79899 Other long term (current) drug therapy: Secondary | ICD-10-CM | POA: Diagnosis not present

## 2016-12-11 DIAGNOSIS — Z800 Family history of malignant neoplasm of digestive organs: Secondary | ICD-10-CM | POA: Diagnosis not present

## 2016-12-11 DIAGNOSIS — K219 Gastro-esophageal reflux disease without esophagitis: Secondary | ICD-10-CM | POA: Insufficient documentation

## 2016-12-11 DIAGNOSIS — Z8546 Personal history of malignant neoplasm of prostate: Secondary | ICD-10-CM | POA: Insufficient documentation

## 2016-12-11 DIAGNOSIS — F419 Anxiety disorder, unspecified: Secondary | ICD-10-CM | POA: Insufficient documentation

## 2016-12-11 HISTORY — DX: Presence of other specified functional implants: Z96.89

## 2016-12-11 HISTORY — PX: UMBILICAL HERNIA REPAIR: SHX196

## 2016-12-11 SURGERY — REPAIR, HERNIA, UMBILICAL, ADULT
Anesthesia: General | Wound class: Clean

## 2016-12-11 MED ORDER — ONDANSETRON HCL 4 MG/2ML IJ SOLN: 4.0000 mg | Freq: Once | INTRAMUSCULAR | Status: DC | PRN

## 2016-12-11 MED ORDER — FENTANYL CITRATE (PF) 100 MCG/2ML IJ SOLN: 25.0000 ug | INTRAMUSCULAR | Status: DC | PRN

## 2016-12-11 MED ORDER — LIDOCAINE HCL (CARDIAC) 20 MG/ML IV SOLN
INTRAVENOUS | Status: DC | PRN
  Administered 2016-12-11: 100 mg via INTRAVENOUS

## 2016-12-11 MED ORDER — PHENYLEPHRINE HCL 10 MG/ML IJ SOLN
INTRAMUSCULAR | Status: DC | PRN
  Administered 2016-12-11 (×2): 200 ug via INTRAVENOUS
  Administered 2016-12-11: 100 ug via INTRAVENOUS
  Administered 2016-12-11 (×3): 200 ug via INTRAVENOUS

## 2016-12-11 MED ORDER — TRAMADOL HCL 50 MG PO TABS: 50.0000 mg | ORAL_TABLET | Freq: Four times a day (QID) | ORAL | 0 refills | Status: DC | PRN

## 2016-12-11 MED ORDER — ONDANSETRON HCL 4 MG/2ML IJ SOLN
INTRAMUSCULAR | Status: DC | PRN
  Administered 2016-12-11: 4 mg via INTRAVENOUS

## 2016-12-11 MED ORDER — MIDAZOLAM HCL 2 MG/2ML IJ SOLN
INTRAMUSCULAR | Status: AC
  Filled 2016-12-11: qty 2

## 2016-12-11 MED ORDER — LIDOCAINE HCL (PF) 2 % IJ SOLN
INTRAMUSCULAR | Status: AC
  Filled 2016-12-11: qty 10

## 2016-12-11 MED ORDER — GLYCOPYRROLATE 0.2 MG/ML IJ SOLN
INTRAMUSCULAR | Status: AC
  Filled 2016-12-11: qty 1

## 2016-12-11 MED ORDER — CEFAZOLIN SODIUM-DEXTROSE 2-4 GM/100ML-% IV SOLN
INTRAVENOUS | Status: AC
  Filled 2016-12-11: qty 100

## 2016-12-11 MED ORDER — LACTATED RINGERS IV SOLN
INTRAVENOUS | Status: DC
  Administered 2016-12-11: 50 mL/h via INTRAVENOUS

## 2016-12-11 MED ORDER — DEXAMETHASONE SODIUM PHOSPHATE 10 MG/ML IJ SOLN
INTRAMUSCULAR | Status: DC | PRN
Start: 2016-12-11 — End: 2016-12-11
  Administered 2016-12-11: 10 mg via INTRAVENOUS

## 2016-12-11 MED ORDER — PHENYLEPHRINE HCL 10 MG/ML IJ SOLN
INTRAMUSCULAR | Status: AC
  Filled 2016-12-11: qty 1

## 2016-12-11 MED ORDER — EPHEDRINE SULFATE 50 MG/ML IJ SOLN
INTRAMUSCULAR | Status: DC | PRN
  Administered 2016-12-11 (×2): 10 mg via INTRAVENOUS

## 2016-12-11 MED ORDER — SUCCINYLCHOLINE CHLORIDE 20 MG/ML IJ SOLN
INTRAMUSCULAR | Status: AC
  Filled 2016-12-11: qty 1

## 2016-12-11 MED ORDER — GLYCOPYRROLATE 0.2 MG/ML IJ SOLN
INTRAMUSCULAR | Status: DC | PRN
  Administered 2016-12-11: 0.2 mg via INTRAVENOUS

## 2016-12-11 MED ORDER — ACETAMINOPHEN 10 MG/ML IV SOLN
INTRAVENOUS | Status: AC
  Filled 2016-12-11: qty 100

## 2016-12-11 MED ORDER — CHLORHEXIDINE GLUCONATE CLOTH 2 % EX PADS: 6.0000 | MEDICATED_PAD | Freq: Once | CUTANEOUS | Status: DC

## 2016-12-11 MED ORDER — EPHEDRINE SULFATE 50 MG/ML IJ SOLN
INTRAMUSCULAR | Status: AC
  Filled 2016-12-11: qty 1

## 2016-12-11 MED ORDER — ONDANSETRON HCL 4 MG/2ML IJ SOLN
INTRAMUSCULAR | Status: AC
  Filled 2016-12-11: qty 2

## 2016-12-11 MED ORDER — BUPIVACAINE-EPINEPHRINE 0.5% -1:200000 IJ SOLN
INTRAMUSCULAR | Status: DC | PRN
  Administered 2016-12-11: 20 mL

## 2016-12-11 MED ORDER — FENTANYL CITRATE (PF) 100 MCG/2ML IJ SOLN
INTRAMUSCULAR | Status: AC
  Filled 2016-12-11: qty 2

## 2016-12-11 MED ORDER — ACETAMINOPHEN 10 MG/ML IV SOLN
INTRAVENOUS | Status: DC | PRN
  Administered 2016-12-11: 1000 mg via INTRAVENOUS

## 2016-12-11 MED ORDER — MIDAZOLAM HCL 2 MG/2ML IJ SOLN
INTRAMUSCULAR | Status: DC | PRN
  Administered 2016-12-11: 2 mg via INTRAVENOUS

## 2016-12-11 MED ORDER — PROPOFOL 10 MG/ML IV BOLUS
INTRAVENOUS | Status: AC
  Filled 2016-12-11: qty 20

## 2016-12-11 MED ORDER — PROPOFOL 10 MG/ML IV BOLUS
INTRAVENOUS | Status: DC | PRN
  Administered 2016-12-11: 200 mg via INTRAVENOUS

## 2016-12-11 MED ORDER — FENTANYL CITRATE (PF) 100 MCG/2ML IJ SOLN
INTRAMUSCULAR | Status: DC | PRN
  Administered 2016-12-11: 50 ug via INTRAVENOUS

## 2016-12-11 MED ORDER — BUPIVACAINE-EPINEPHRINE (PF) 0.5% -1:200000 IJ SOLN
INTRAMUSCULAR | Status: AC
  Filled 2016-12-11: qty 30

## 2016-12-11 MED ORDER — DEXAMETHASONE SODIUM PHOSPHATE 10 MG/ML IJ SOLN
INTRAMUSCULAR | Status: AC
  Filled 2016-12-11: qty 1

## 2016-12-11 SURGICAL SUPPLY — 26 items
BLADE SURG 15 STRL SS SAFETY (BLADE) ×2 IMPLANT
CANISTER SUCT 1200ML W/VALVE (MISCELLANEOUS) ×2 IMPLANT
CHLORAPREP W/TINT 26ML (MISCELLANEOUS) ×2 IMPLANT
DECANTER SPIKE VIAL GLASS SM (MISCELLANEOUS) ×2 IMPLANT
DERMABOND ADVANCED (GAUZE/BANDAGES/DRESSINGS) ×1
DERMABOND ADVANCED .7 DNX12 (GAUZE/BANDAGES/DRESSINGS) ×1 IMPLANT
DRAPE LAPAROTOMY 100X77 ABD (DRAPES) ×2 IMPLANT
ELECT REM PT RETURN 9FT ADLT (ELECTROSURGICAL) ×2
ELECTRODE REM PT RTRN 9FT ADLT (ELECTROSURGICAL) ×1 IMPLANT
GLOVE BIO SURGEON STRL SZ7 (GLOVE) ×4 IMPLANT
GOWN STRL REUS W/ TWL LRG LVL3 (GOWN DISPOSABLE) ×2 IMPLANT
GOWN STRL REUS W/TWL LRG LVL3 (GOWN DISPOSABLE) ×2
KIT RM TURNOVER STRD PROC AR (KITS) ×2 IMPLANT
LABEL OR SOLS (LABEL) IMPLANT
NEEDLE HYPO 22GX1.5 SAFETY (NEEDLE) ×2 IMPLANT
NEEDLE HYPO 25X1 1.5 SAFETY (NEEDLE) ×2 IMPLANT
NS IRRIG 500ML POUR BTL (IV SOLUTION) ×2 IMPLANT
PACK BASIN MINOR ARMC (MISCELLANEOUS) ×2 IMPLANT
SUT PROLENE 0 CT 2 (SUTURE) ×2 IMPLANT
SUT VIC AB 2-0 SH 27 (SUTURE) ×1
SUT VIC AB 2-0 SH 27XBRD (SUTURE) ×1 IMPLANT
SUT VIC AB 3-0 SH 27 (SUTURE) ×1
SUT VIC AB 3-0 SH 27X BRD (SUTURE) ×1 IMPLANT
SUT VIC AB 4-0 FS2 27 (SUTURE) ×2 IMPLANT
SUT VICRYL+ 3-0 144IN (SUTURE) ×2 IMPLANT
SYR CONTROL 10ML (SYRINGE) ×2 IMPLANT

## 2016-12-11 NOTE — Transfer of Care (Signed)
Immediate Anesthesia Transfer of Care Note  Patient: Brandon Huffman  Procedure(s) Performed: Procedure(s): HERNIA REPAIR UMBILICAL ADULT (N/A)  Patient Location: PACU  Anesthesia Type:General  Level of Consciousness: sedated  Airway & Oxygen Therapy: Patient Spontanous Breathing and Patient connected to face mask oxygen  Post-op Assessment: Report given to RN and Post -op Vital signs reviewed and stable  Post vital signs: Reviewed and stable  Last Vitals:  Vitals:   12/11/16 0619 12/11/16 0826  BP: 121/79 116/75  Pulse: 79 77  Resp: 16 18  Temp: 36.6 C 36.6 C  SpO2: 32% 549%    Complications: No apparent anesthesia complications

## 2016-12-11 NOTE — Anesthesia Preprocedure Evaluation (Signed)
Anesthesia Evaluation  Patient identified by MRN, date of birth, ID band Patient awake    Reviewed: Allergy & Precautions, NPO status , Patient's Chart, lab work & pertinent test results, reviewed documented beta blocker date and time   Airway Mallampati: III  TM Distance: >3 FB     Dental  (+) Chipped   Pulmonary sleep apnea and Continuous Positive Airway Pressure Ventilation ,           Cardiovascular + Peripheral Vascular Disease       Neuro/Psych Anxiety    GI/Hepatic GERD  Controlled,  Endo/Other    Renal/GU      Musculoskeletal   Abdominal   Peds  Hematology   Anesthesia Other Findings   Reproductive/Obstetrics                             Anesthesia Physical Anesthesia Plan  ASA: III  Anesthesia Plan: General   Post-op Pain Management:    Induction: Intravenous  PONV Risk Score and Plan:   Airway Management Planned: LMA  Additional Equipment:   Intra-op Plan:   Post-operative Plan:   Informed Consent: I have reviewed the patients History and Physical, chart, labs and discussed the procedure including the risks, benefits and alternatives for the proposed anesthesia with the patient or authorized representative who has indicated his/her understanding and acceptance.     Plan Discussed with: CRNA  Anesthesia Plan Comments:         Anesthesia Quick Evaluation

## 2016-12-11 NOTE — Interval H&P Note (Signed)
History and Physical Interval Note:  12/11/2016 7:19 AM  Brandon Huffman  has presented today for surgery, with the diagnosis of UMBILICAL HERNIA  The various methods of treatment have been discussed with the patient and family. After consideration of risks, benefits and other options for treatment, the patient has consented to  Procedure(s): HERNIA REPAIR UMBILICAL ADULT (N/A) as a surgical intervention .  The patient's history has been reviewed, patient examined, no change in status, stable for surgery.  I have reviewed the patient's chart and labs.  Questions were answered to the patient's satisfaction.     [name]

## 2016-12-11 NOTE — Anesthesia Procedure Notes (Signed)
Procedure Name: LMA Insertion Date/Time: 12/11/2016 7:35 AM Performed by: Doreen Salvage, CRNA Pre-anesthesia Checklist: Patient identified, Patient being monitored, Timeout performed, Emergency Drugs available and Suction available Patient Re-evaluated:Patient Re-evaluated prior to induction Oxygen Delivery Method: Circle system utilized Preoxygenation: Pre-oxygenation with 100% oxygen Induction Type: IV induction Ventilation: Mask ventilation without difficulty LMA: LMA inserted LMA Size: 5.0 Tube type: Oral Number of attempts: 1 Placement Confirmation: positive ETCO2 and breath sounds checked- equal and bilateral Tube secured with: Tape Dental Injury: Teeth and Oropharynx as per pre-operative assessment

## 2016-12-11 NOTE — Anesthesia Postprocedure Evaluation (Signed)
Anesthesia Post Note  Patient: Brandon Huffman  Procedure(s) Performed: HERNIA REPAIR UMBILICAL ADULT (N/A )  Patient location during evaluation: PACU Anesthesia Type: General Level of consciousness: awake and alert Pain management: pain level controlled Vital Signs Assessment: post-procedure vital signs reviewed and stable Respiratory status: spontaneous breathing, nonlabored ventilation, respiratory function stable and patient connected to nasal cannula oxygen Cardiovascular status: blood pressure returned to baseline and stable Postop Assessment: no apparent nausea or vomiting Anesthetic complications: no     Last Vitals:  Vitals:   12/11/16 0912 12/11/16 0936  BP: 129/84 127/86  Pulse: 80 77  Resp: 16   Temp: 36.4 C   SpO2: 98% 98%    Last Pain:  Vitals:   12/11/16 0936  TempSrc:   PainSc: 0-No pain                 Leroy Trim S

## 2016-12-11 NOTE — Progress Notes (Signed)
Dr. Jamal Collin into see

## 2016-12-11 NOTE — Discharge Instructions (Signed)

## 2016-12-11 NOTE — Anesthesia Post-op Follow-up Note (Signed)
Anesthesia QCDR form completed.        

## 2016-12-11 NOTE — Op Note (Signed)
Preop diagnosis small umbilical hernia  Post op diagnosis: Same  Operation: Repair umbilical hernia  Surgeon: Mckinley Jewel  Assistant:     Anesthesia: General  Complications: None  EBL: Minimal  Drains: None  Description: Patient was put to sleep with an LMA and the abdomen prepped and draped sterile field.  Timeout was performed. Patient had clinically a small hernia just above and to the left of the umbilicus from the site of her previous a small incision.  CT showed a very tiny defect with a fatty hernia at the site.  0.5% Marcaine mixed with 1 plain Xylocaine was instilled with epi was instilled on either side of the previous incision just above the umbilicus totaling 20 mL.  The incision was reopened and the scar tissue was carefully opened down to the subcutaneous tissue.  Is somewhat irregularly lobulated fatty tissue was identified about a centimeter and a half in size this was freed and excised completely from the fascia.  A single less than 5 mm opening in the fascia was noted to the left of the midline closure.  This was the site of the hernia noted on the CT. small opening was closed with a single stitch of 0 Prolene.  The subcutaneous tissue was then approximated with 2-0 Vicryl.  Subcutaneous tissue skin closed with subcuticular 4-0 Vicryl covered with Dermabond.  Procedure was well-tolerated and he was subsequently returned to recovery room in stable condition

## 2016-12-16 ENCOUNTER — Encounter: Payer: Self-pay | Admitting: General Surgery

## 2016-12-16 ENCOUNTER — Ambulatory Visit (INDEPENDENT_AMBULATORY_CARE_PROVIDER_SITE_OTHER): Payer: BLUE CROSS/BLUE SHIELD | Admitting: General Surgery

## 2016-12-16 VITALS — BP 136/82 | HR 67 | Resp 14 | Ht 75.0 in | Wt 209.8 lb

## 2016-12-16 DIAGNOSIS — K439 Ventral hernia without obstruction or gangrene: Secondary | ICD-10-CM

## 2016-12-16 NOTE — Patient Instructions (Addendum)
Return in three weeks.. 

## 2016-12-16 NOTE — Progress Notes (Signed)
Patient ID: Brandon Huffman, male   DOB: 09-Jun-1950, 67 y.o.   MRN: [id number]  Chief Complaint  Patient presents with  . Routine Post Op    HPI Brandon Huffman is a 66 y.o. male here today for his post op umbilical hernia repair done on 12/11/2016. Patient states he is doing well.  HPI  Past Medical History:  Diagnosis Date  . Adult ADHD    Dr. Rosine Door  . Allergy   . Cancer ([place]) 2012   PROSTATE  . Family hx of colon cancer 05/08/2016  . GERD (gastroesophageal reflux disease)   . History of implantation of artificial sphincter   . Hx of malignant neoplasm of prostate 05/08/2016   2012, s/p radical prostatectomy, urologist is Dr. Tresa Endo [facility]  . Hx of malignant neoplasm of prostate 05/08/2016   2012, s/p radical prostatectomy, urologist is Dr. Tresa Endo [facility]  . Insomnia   . Sleep apnea    CPAP  . Varicose vein of leg    removed veins in 1972    Past Surgical History:  Procedure Laterality Date  . artificaill sphincter  12/2015  . COLONOSCOPY  2016  . HERNIA REPAIR  5573   umbilical   . PENILE PROSTHESIS IMPLANT    . PROSTATECTOMY  2012   Radical Prostatectomy.  Marland Kitchen UMBILICAL HERNIA REPAIR N/A 12/11/2016   Procedure: HERNIA REPAIR UMBILICAL ADULT;  Surgeon: Christene Lye, MD;  Location: [facility] ORS;  Service: General;  Laterality: N/A;  . VARICOSE VEIN SURGERY Left 1972    Family History  Problem Relation Age of Onset  . Cancer Sister        Colon CA  . Cancer Brother        Prostate CA  . Pneumonia Mother   . Diabetes Brother   . Cancer Sister        adrenal   . Cancer Brother        prostate-remission  . Irritable bowel syndrome Brother     Social History Social History   Tobacco Use  . Smoking status: Never Smoker  . Smokeless tobacco: Never Used  Substance Use Topics  . Alcohol use: Yes    Alcohol/week: 1.2 oz    Types: 2 Glasses of wine per week    Comment: WINE OCC  . Drug use: No    Allergies  Allergen Reactions  . Chocolate  Flavor Other (See Comments)    Cysts under the skin.  . Tetracyclines & Related Hives and Rash    Current Outpatient Medications  Medication Sig Dispense Refill  . aspirin 81 MG tablet Chew 81 mg by mouth.    . Flaxseed, Linseed, (FLAX SEED OIL) 1000 MG CAPS Take 1,000 mg by mouth daily.    . fluticasone (FLONASE) 50 MCG/ACT nasal spray Place 1 spray into both nostrils as needed. Reported on 03/21/2015    . gabapentin (NEURONTIN) 300 MG capsule Take 300 mg at bedtime as needed by mouth (nerve pain). Reported on 03/21/2015    . GRAPE SEED EXTRACT PO Take 1 capsule daily by mouth.    . Humidifier MISC     . Melatonin 3 MG TABS Take 3 mg by mouth at bedtime. Reported on 03/21/2015    . Multiple Vitamins-Minerals (MULTIVITAMIN WITH MINERALS) tablet Take 1 tablet daily by mouth.     Marland Kitchen OVER THE COUNTER MEDICATION Take 1 tablet daily by mouth. Privigin - for memory    . ranitidine (ZANTAC) 150 MG tablet Take 150  mg by mouth every morning.     . traZODone (DESYREL) 50 MG tablet Take 0.5-1 tablets (25-50 mg total) by mouth at bedtime as needed for sleep. 30 tablet 11  . Turmeric 500 MG CAPS Take 500 mg by mouth daily.     No current facility-administered medications for this visit.     Review of Systems Review of Systems  Constitutional: Negative.   Respiratory: Negative.   Cardiovascular: Negative.     Blood pressure 136/82, pulse 67, resp. rate 14, height 6\' 3"  (1.905 m), weight 209 lb 12.8 oz (95.2 kg), SpO2 99 %.  Physical Exam Physical Exam  Constitutional: He is oriented to person, place, and time. He appears well-developed and well-nourished.  Abdominal: Normal appearance. No hernia.  Umbilical hernia repair is intact and well healed.   Neurological: He is alert and oriented to person, place, and time.  Skin: Skin is warm and dry.    Data Reviewed [facility] notes reviewed   Assessment    Patient had a very small hernia above the umbilicus with a 5 mm fascial opening.. This was  repaired with a single Prolene stitch. Incision was clean and is doing well    Plan    Patient to return in three weeks. The patient is aware to call back for any questions or concerns.     HPI, Physical Exam, Assessment and Plan have been scribed under the direction and in the presence of Mckinley Jewel, MD  Gaspar Cola, CMA  I have completed the exam and reviewed the above documentation for accuracy and completeness.  I agree with the above.  Haematologist has been used and any errors in dictation or transcription are unintentional.  Shelaine Frie G. Jamal Collin, M.D., F.A.C.S.   Junie Panning G 12/25/2016, 10:37 AM

## 2017-01-07 ENCOUNTER — Encounter: Payer: Self-pay | Admitting: General Surgery

## 2017-01-07 ENCOUNTER — Ambulatory Visit (INDEPENDENT_AMBULATORY_CARE_PROVIDER_SITE_OTHER): Payer: BLUE CROSS/BLUE SHIELD | Admitting: General Surgery

## 2017-01-07 VITALS — BP 128/74 | HR 91 | Resp 14 | Ht 75.0 in | Wt 206.0 lb

## 2017-01-07 DIAGNOSIS — K429 Umbilical hernia without obstruction or gangrene: Secondary | ICD-10-CM

## 2017-01-07 NOTE — Patient Instructions (Signed)
The patient is aware to call back for any questions or concerns.  

## 2017-01-07 NOTE — Progress Notes (Signed)
Patient ID: Brandon Huffman, male   DOB: 1950/11/25, 66 y.o.   MRN: [id number]  Chief Complaint  Patient presents with  . Routine Post Op    HPI Brandon Huffman is a 66 y.o. male.  Here today for postoperative visit, umbilical hernia on 96[birth year], he states he is doing well. Denies any gastrointestinal issues, bowels are moving regular. Occasional pain above incision, unchanged from before.  HPI  Past Medical History:  Diagnosis Date  . Adult ADHD    Dr. Rosine Door  . Allergy   . Cancer ([place]) 2012   PROSTATE  . Family hx of colon cancer 05/08/2016  . GERD (gastroesophageal reflux disease)   . History of implantation of artificial sphincter   . Hx of malignant neoplasm of prostate 05/08/2016   2012, s/p radical prostatectomy, urologist is Dr. Tresa Endo [facility]  . Hx of malignant neoplasm of prostate 05/08/2016   2012, s/p radical prostatectomy, urologist is Dr. Tresa Endo [facility]  . Insomnia   . Sleep apnea    CPAP  . Varicose vein of leg    removed veins in 1972    Past Surgical History:  Procedure Laterality Date  . artificaill sphincter  12/2015  . COLONOSCOPY  2016  . HERNIA REPAIR  8413   umbilical   . PENILE PROSTHESIS IMPLANT    . PROSTATECTOMY  2012   Radical Prostatectomy.  Marland Kitchen UMBILICAL HERNIA REPAIR N/A 12/11/2016   Procedure: HERNIA REPAIR UMBILICAL ADULT;  Surgeon: Christene Lye, MD;  Location: [facility] ORS;  Service: General;  Laterality: N/A;  . VARICOSE VEIN SURGERY Left 1972    Family History  Problem Relation Age of Onset  . Cancer Sister        Colon CA  . Cancer Brother        Prostate CA  . Pneumonia Mother   . Diabetes Brother   . Cancer Sister        adrenal   . Cancer Brother        prostate-remission  . Irritable bowel syndrome Brother     Social History Social History   Tobacco Use  . Smoking status: Never Smoker  . Smokeless tobacco: Never Used  Substance Use Topics  . Alcohol use: Yes    Alcohol/week: 1.2 oz    Types: 2 Glasses  of wine per week    Comment: WINE OCC  . Drug use: No    Allergies  Allergen Reactions  . Chocolate Flavor Other (See Comments)    Cysts under the skin.  . Tetracyclines & Related Hives and Rash    Current Outpatient Medications  Medication Sig Dispense Refill  . aspirin 81 MG tablet Chew 81 mg by mouth.    . Flaxseed, Linseed, (FLAX SEED OIL) 1000 MG CAPS Take 1,000 mg by mouth daily.    . fluticasone (FLONASE) 50 MCG/ACT nasal spray Place 1 spray into both nostrils as needed. Reported on 03/21/2015    . gabapentin (NEURONTIN) 300 MG capsule Take 300 mg at bedtime as needed by mouth (nerve pain). Reported on 03/21/2015    . GRAPE SEED EXTRACT PO Take 1 capsule daily by mouth.    . Humidifier MISC     . Melatonin 3 MG TABS Take 3 mg by mouth at bedtime. Reported on 03/21/2015    . Multiple Vitamins-Minerals (MULTIVITAMIN WITH MINERALS) tablet Take 1 tablet daily by mouth.     Marland Kitchen OVER THE COUNTER MEDICATION Take 1 tablet daily by mouth. Privigin -  for memory    . ranitidine (ZANTAC) 150 MG tablet Take 150 mg by mouth every morning.     . traZODone (DESYREL) 50 MG tablet Take 0.5-1 tablets (25-50 mg total) by mouth at bedtime as needed for sleep. 30 tablet 11  . Turmeric 500 MG CAPS Take 500 mg by mouth daily.     No current facility-administered medications for this visit.     Review of Systems Review of Systems  Constitutional: Negative.   Respiratory: Negative.   Cardiovascular: Negative.     Blood pressure 128/74, pulse 91, resp. rate 14, height 6\' 3"  (1.905 m), weight 206 lb (93.4 kg).  Physical Exam Physical Exam  Constitutional: He is oriented to person, place, and time. He appears well-developed and well-nourished.  Abdominal: Soft. There is no tenderness.    Neurological: He is alert and oriented to person, place, and time.  Skin: Skin is warm and dry.  Psychiatric: His behavior is normal.    Data Reviewed  [facility] notes reviewed    Assessment    Patient had a  very small hernia above the umbilicus with a 5 mm fascial opening. This was repaired with a single Prolene stitch. Well healed incision.      Plan    The patient is aware to call back for any questions or new concerns.  Follow up as needed.     HPI, Physical Exam, Assessment and Plan have been scribed under the direction and in the presence of Mckinley Jewel, MD Karie Fetch, RN  I have completed the exam and reviewed the above documentation for accuracy and completeness.  I agree with the above.  Haematologist has been used and any errors in dictation or transcription are unintentional.  Vinnie Bobst G. Jamal Collin, M.D., F.A.C.S.  Junie Panning G 01/07/2017, 9:56 AM

## 2017-05-07 ENCOUNTER — Encounter: Payer: BLUE CROSS/BLUE SHIELD | Admitting: Family Medicine

## 2017-05-11 ENCOUNTER — Encounter: Payer: BLUE CROSS/BLUE SHIELD | Admitting: Family Medicine

## 2017-05-14 ENCOUNTER — Encounter: Payer: PRIVATE HEALTH INSURANCE | Admitting: Family Medicine

## 2017-05-28 ENCOUNTER — Encounter: Payer: PRIVATE HEALTH INSURANCE | Admitting: Family Medicine

## 2017-06-18 ENCOUNTER — Encounter: Payer: Self-pay | Admitting: Family Medicine

## 2017-06-18 ENCOUNTER — Ambulatory Visit (INDEPENDENT_AMBULATORY_CARE_PROVIDER_SITE_OTHER): Payer: Medicare Other | Admitting: Family Medicine

## 2017-06-18 VITALS — BP 136/82 | HR 68 | Temp 97.5°F | Resp 12 | Ht 73.0 in | Wt 205.5 lb

## 2017-06-18 DIAGNOSIS — Z0000 Encounter for general adult medical examination without abnormal findings: Secondary | ICD-10-CM

## 2017-06-18 DIAGNOSIS — Z5181 Encounter for therapeutic drug level monitoring: Secondary | ICD-10-CM | POA: Insufficient documentation

## 2017-06-18 DIAGNOSIS — Z8546 Personal history of malignant neoplasm of prostate: Secondary | ICD-10-CM

## 2017-06-18 DIAGNOSIS — E663 Overweight: Secondary | ICD-10-CM

## 2017-06-18 DIAGNOSIS — K432 Incisional hernia without obstruction or gangrene: Secondary | ICD-10-CM

## 2017-06-18 NOTE — Patient Instructions (Addendum)
Try to follow the DASH guidelines (DASH stands for Dietary Approaches to Stop Hypertension). Try to limit the sodium in your diet to no more than 1,500mg  of sodium per day. Certainly try to not exceed 2,000 mg per day at the very most. Do not add salt when cooking or at the table.  Check the sodium amount on labels when shopping, and choose items lower in sodium when given a choice. Avoid or limit foods that already contain a lot of sodium. Eat a diet rich in fruits and vegetables and whole grains, and try to lose weight if overweight or obese   Consider getting the new shingles vaccine called Shingrix; that is available for individuals 66 years of age and older, and is recommended even if you have had shingles in the past and/or already received the old shingles vaccine (Zostavax); it is a two-part series, and is available at many local pharmacies You can also ask the pharmacist for PCV-13 (Prevnar) You should receive another kind of pneumonia vaccine in one year called PPSV-23 (Pneumovax)   DASH Eating Plan DASH stands for "Dietary Approaches to Stop Hypertension." The DASH eating plan is a healthy eating plan that has been shown to reduce high blood pressure (hypertension). It may also reduce your risk for type 2 diabetes, heart disease, and stroke. The DASH eating plan may also help with weight loss. What are tips for following this plan? General guidelines  Avoid eating more than 2,300 mg (milligrams) of salt (sodium) a day. If you have hypertension, you may need to reduce your sodium intake to 1,500 mg a day.  Limit alcohol intake to no more than 1 drink a day for nonpregnant women and 2 drinks a day for men. One drink equals 12 oz of beer, 5 oz of wine, or 1 oz of hard liquor.  Work with your health care provider to maintain a healthy body weight or to lose weight. Ask what an ideal weight is for you.  Get at least 30 minutes of exercise that causes your heart to beat faster (aerobic  exercise) most days of the week. Activities may include walking, swimming, or biking.  Work with your health care provider or diet and nutrition specialist (dietitian) to adjust your eating plan to your individual calorie needs. Reading food labels  Check food labels for the amount of sodium per serving. Choose foods with less than 5 percent of the Daily Value of sodium. Generally, foods with less than 300 mg of sodium per serving fit into this eating plan.  To find whole grains, look for the word "whole" as the first word in the ingredient list. Shopping  Buy products labeled as "low-sodium" or "no salt added."  Buy fresh foods. Avoid canned foods and premade or frozen meals. Cooking  Avoid adding salt when cooking. Use salt-free seasonings or herbs instead of table salt or sea salt. Check with your health care provider or pharmacist before using salt substitutes.  Do not fry foods. Cook foods using healthy methods such as baking, boiling, grilling, and broiling instead.  Cook with heart-healthy oils, such as olive, canola, soybean, or sunflower oil. Meal planning   Eat a balanced diet that includes: ? 5 or more servings of fruits and vegetables each day. At each meal, try to fill half of your plate with fruits and vegetables. ? Up to 6-8 servings of whole grains each day. ? Less than 6 oz of lean meat, poultry, or fish each day. A 3-oz serving of  meat is about the same size as a deck of cards. One egg equals 1 oz. ? 2 servings of low-fat dairy each day. ? A serving of nuts, seeds, or beans 5 times each week. ? Heart-healthy fats. Healthy fats called Omega-3 fatty acids are found in foods such as flaxseeds and coldwater fish, like sardines, salmon, and mackerel.  Limit how much you eat of the following: ? Canned or prepackaged foods. ? Food that is high in trans fat, such as fried foods. ? Food that is high in saturated fat, such as fatty meat. ? Sweets, desserts, sugary drinks,  and other foods with added sugar. ? Full-fat dairy products.  Do not salt foods before eating.  Try to eat at least 2 vegetarian meals each week.  Eat more home-cooked food and less restaurant, buffet, and fast food.  When eating at a restaurant, ask that your food be prepared with less salt or no salt, if possible. What foods are recommended? The items listed may not be a complete list. Talk with your dietitian about what dietary choices are best for you. Grains Whole-grain or whole-wheat bread. Whole-grain or whole-wheat pasta. Brown rice. [place]. Bulgur. Whole-grain and low-sodium cereals. Pita bread. Low-fat, low-sodium crackers. Whole-wheat flour tortillas. Vegetables Fresh or frozen vegetables (raw, steamed, roasted, or grilled). Low-sodium or reduced-sodium tomato and vegetable juice. Low-sodium or reduced-sodium tomato sauce and tomato paste. Low-sodium or reduced-sodium canned vegetables. Fruits All fresh, dried, or frozen fruit. Canned fruit in natural juice (without added sugar). Meat and other protein foods Skinless chicken or [place]. Ground chicken or [place]. Pork with fat trimmed off. Fish and seafood. Egg whites. Dried beans, peas, or lentils. Unsalted nuts, nut butters, and seeds. Unsalted canned beans. Lean cuts of beef with fat trimmed off. Low-sodium, lean deli meat. Dairy Low-fat (1%) or fat-free (skim) milk. Fat-free, low-fat, or reduced-fat cheeses. Nonfat, low-sodium ricotta or cottage cheese. Low-fat or nonfat yogurt. Low-fat, low-sodium cheese. Fats and oils Soft margarine without trans fats. Vegetable oil. Low-fat, reduced-fat, or light mayonnaise and salad dressings (reduced-sodium). Canola, safflower, olive, soybean, and sunflower oils. Avocado. Seasoning and other foods Herbs. Spices. Seasoning mixes without salt. Unsalted popcorn and pretzels. Fat-free sweets. What foods are not recommended? The items listed may not be a complete list. Talk with your  dietitian about what dietary choices are best for you. Grains Baked goods made with fat, such as croissants, muffins, or some breads. Dry pasta or rice meal packs. Vegetables Creamed or fried vegetables. Vegetables in a cheese sauce. Regular canned vegetables (not low-sodium or reduced-sodium). Regular canned tomato sauce and paste (not low-sodium or reduced-sodium). Regular tomato and vegetable juice (not low-sodium or reduced-sodium). Angie Fava. Olives. Fruits Canned fruit in a light or heavy syrup. Fried fruit. Fruit in cream or butter sauce. Meat and other protein foods Fatty cuts of meat. Ribs. Fried meat. Berniece Salines. Sausage. Bologna and other processed lunch meats. Salami. Fatback. Hotdogs. Bratwurst. Salted nuts and seeds. Canned beans with added salt. Canned or smoked fish. Whole eggs or egg yolks. Chicken or [place] with skin. Dairy Whole or 2% milk, cream, and half-and-half. Whole or full-fat cream cheese. Whole-fat or sweetened yogurt. Full-fat cheese. Nondairy creamers. Whipped toppings. Processed cheese and cheese spreads. Fats and oils Butter. Stick margarine. Lard. Shortening. Ghee. Bacon fat. Tropical oils, such as coconut, palm kernel, or palm oil. Seasoning and other foods Salted popcorn and pretzels. Onion salt, garlic salt, seasoned salt, table salt, and sea salt. Worcestershire sauce. Tartar sauce. Barbecue sauce. Teriyaki  sauce. Soy sauce, including reduced-sodium. Steak sauce. Canned and packaged gravies. Fish sauce. Oyster sauce. Cocktail sauce. Horseradish that you find on the shelf. Ketchup. Mustard. Meat flavorings and tenderizers. Bouillon cubes. Hot sauce and Tabasco sauce. Premade or packaged marinades. Premade or packaged taco seasonings. Relishes. Regular salad dressings. Where to find more information:  National Heart, Lung, and [place]: https://wilson-eaton.com/  American Heart Association: www.heart.org Summary  The DASH eating plan is a healthy eating plan that has  been shown to reduce high blood pressure (hypertension). It may also reduce your risk for type 2 diabetes, heart disease, and stroke.  With the DASH eating plan, you should limit salt (sodium) intake to 2,300 mg a day. If you have hypertension, you may need to reduce your sodium intake to 1,500 mg a day.  When on the DASH eating plan, aim to eat more fresh fruits and vegetables, whole grains, lean proteins, low-fat dairy, and heart-healthy fats.  Work with your health care provider or diet and nutrition specialist (dietitian) to adjust your eating plan to your individual calorie needs. This information is not intended to replace advice given to you by your health care provider. Make sure you discuss any questions you have with your health care provider. Document Released: 12/18/2010 Document Revised: 12/23/2015 Document Reviewed: 12/23/2015 Elsevier Interactive Patient Education  2018 [place] Maintenance, Male A healthy lifestyle and preventive care is important for your health and wellness. Ask your health care provider about what schedule of regular examinations is right for you. What should I know about weight and diet? Eat a Healthy Diet  Eat plenty of vegetables, fruits, whole grains, low-fat dairy products, and lean protein.  Do not eat a lot of foods high in solid fats, added sugars, or salt.  Maintain a Healthy Weight Regular exercise can help you achieve or maintain a healthy weight. You should:  Do at least 150 minutes of exercise each week. The exercise should increase your heart rate and make you sweat (moderate-intensity exercise).  Do strength-training exercises at least twice a week.  Watch Your Levels of Cholesterol and Blood Lipids  Have your blood tested for lipids and cholesterol every 5 years starting at 65 years of age. If you are at high risk for heart disease, you should start having your blood tested when you are 65 years old. You may need to have  your cholesterol levels checked more often if: ? Your lipid or cholesterol levels are high. ? You are older than 65 years of age. ? You are at high risk for heart disease.  What should I know about cancer screening? Many types of cancers can be detected early and may often be prevented. Lung Cancer  You should be screened every year for lung cancer if: ? You are a current smoker who has smoked for at least 30 years. ? You are a former smoker who has quit within the past 15 years.  Talk to your health care provider about your screening options, when you should start screening, and how often you should be screened.  Colorectal Cancer  Routine colorectal cancer screening usually begins at 65 years of age and should be repeated every 5-10 years until you are 65 years old. You may need to be screened more often if early forms of precancerous polyps or small growths are found. Your health care provider may recommend screening at an earlier age if you have risk factors for colon cancer.  Your health care provider may  recommend using home test kits to check for hidden blood in the stool.  A small camera at the end of a tube can be used to examine your colon (sigmoidoscopy or colonoscopy). This checks for the earliest forms of colorectal cancer.  Prostate and Testicular Cancer  Depending on your age and overall health, your health care provider may do certain tests to screen for prostate and testicular cancer.  Talk to your health care provider about any symptoms or concerns you have about testicular or prostate cancer.  Skin Cancer  Check your skin from head to toe regularly.  Tell your health care provider about any new moles or changes in moles, especially if: ? There is a change in a mole's size, shape, or color. ? You have a mole that is larger than a pencil eraser.  Always use sunscreen. Apply sunscreen liberally and repeat throughout the day.  Protect yourself by wearing long  sleeves, pants, a wide-brimmed hat, and sunglasses when outside.  What should I know about heart disease, diabetes, and high blood pressure?  If you are 14-54 years of age, have your blood pressure checked every 3-5 years. If you are 38 years of age or older, have your blood pressure checked every year. You should have your blood pressure measured twice-once when you are at a hospital or clinic, and once when you are not at a hospital or clinic. Record the average of the two measurements. To check your blood pressure when you are not at a hospital or clinic, you can use: ? An automated blood pressure machine at a pharmacy. ? A home blood pressure monitor.  Talk to your health care provider about your target blood pressure.  If you are between 41-69 years old, ask your health care provider if you should take aspirin to prevent heart disease.  Have regular diabetes screenings by checking your fasting blood sugar level. ? If you are at a normal weight and have a low risk for diabetes, have this test once every three years after the age of 18. ? If you are overweight and have a high risk for diabetes, consider being tested at a younger age or more often.  A one-time screening for abdominal aortic aneurysm (AAA) by ultrasound is recommended for men aged 10-75 years who are current or former smokers. What should I know about preventing infection? Hepatitis B If you have a higher risk for hepatitis B, you should be screened for this virus. Talk with your health care provider to find out if you are at risk for hepatitis B infection. Hepatitis C Blood testing is recommended for:  Everyone born from [birth year] through 1963.  Anyone with known risk factors for hepatitis C.  Sexually Transmitted Diseases (STDs)  You should be screened each year for STDs including gonorrhea and chlamydia if: ? You are sexually active and are younger than 65 years of age. ? You are older than 65 years of age and your  health care provider tells you that you are at risk for this type of infection. ? Your sexual activity has changed since you were last screened and you are at an increased risk for chlamydia or gonorrhea. Ask your health care provider if you are at risk.  Talk with your health care provider about whether you are at high risk of being infected with HIV. Your health care provider may recommend a prescription medicine to help prevent HIV infection.  What else can I do?  Schedule regular health, dental,  and eye exams.  Stay current with your vaccines (immunizations).  Do not use any tobacco products, such as cigarettes, chewing tobacco, and e-cigarettes. If you need help quitting, ask your health care provider.  Limit alcohol intake to no more than 2 drinks per day. One drink equals 12 ounces of beer, 5 ounces of wine, or 1 ounces of hard liquor.  Do not use street drugs.  Do not share needles.  Ask your health care provider for help if you need support or information about quitting drugs.  Tell your health care provider if you often feel depressed.  Tell your health care provider if you have ever been abused or do not feel safe at home. This information is not intended to replace advice given to you by your health care provider. Make sure you discuss any questions you have with your health care provider. Document Released: 06/27/2007 Document Revised: 08/28/2015 Document Reviewed: 10/02/2014 Elsevier Interactive Patient Education  [employer].

## 2017-06-18 NOTE — Assessment & Plan Note (Signed)
Check PSA and forward to Dr. Rosana Hoes

## 2017-06-18 NOTE — Assessment & Plan Note (Signed)
Check labs today.

## 2017-06-18 NOTE — Progress Notes (Signed)
Patient: Brandon Huffman, Male    DOB: 1950-08-15, 67 y.o.   MRN: [id number]  Visit Date: 06/18/2017  Today's Provider: Enid Derry, MD   Chief Complaint  Patient presents with  . Annual Exam    welcome to medicare    Subjective:   Brandon Huffman is a 67 y.o. male who presents today for his Initial Welcome to [employer] Visit.  USPSTF grade A and B recommendations Depression:  Depression screen [facility] 2/9 06/18/2017 10/29/2016 05/08/2016 03/21/2015  Decreased Interest 0 0 0 -  Down, Depressed, Hopeless 0 0 0 0  PHQ - 2 Score 0 0 0 0   Hypertension: not much salt BP Readings from Last 3 Encounters:  06/18/17 136/82  01/07/17 128/74  12/16/16 136/82   Obesity: diet and exercise, more mediterranean Wt Readings from Last 3 Encounters:  06/18/17 205 lb 8 oz (93.2 kg)  01/07/17 206 lb (93.4 kg)  12/16/16 209 lb 12.8 oz (95.2 kg)   BMI Readings from Last 3 Encounters:  06/18/17 27.11 kg/m  01/07/17 25.75 kg/m  12/16/16 26.22 kg/m    Immunizations: discussed Skin cancer: looked at one on back, kept growing; revivewed today, still c/w SK Lung cancer:  nonsmoker Prostate cancer: hx, followed by Dr. 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 STD testing and prevention (chl/gon/syphilis): n/a Glucose:  Glucose  Date Value Ref Range Status  04/03/2015 97 65 - 99 mg/dL Final   Glucose, Bld  Date Value Ref Range Status  05/08/2016 88 65 - 99 mg/dL Final   Lipids:  Lab Results  Component Value Date   CHOL 161 05/08/2016   CHOL 135 04/03/2015   Lab Results  Component Value Date   HDL 41 05/08/2016   HDL 52  04/03/2015   Lab Results  Component Value Date   LDLCALC 89 05/08/2016   LDLCALC 68 04/03/2015   Lab Results  Component Value Date   TRIG 157 (H) 05/08/2016   TRIG 74 04/03/2015   Lab Results  Component Value Date   CHOLHDL 3.9 05/08/2016   CHOLHDL 2.6 04/03/2015   No results found for: LDLDIRECT   Review of Systems  Past Medical History:  Diagnosis Date  . Adult ADHD    Dr. Rosine Door  . Allergy   . Cancer ([place]) 2012   PROSTATE  . Family hx of colon cancer 05/08/2016  . GERD (gastroesophageal reflux disease)   . History of implantation of artificial sphincter   . Hx of malignant neoplasm of prostate 05/08/2016   2012, s/p radical prostatectomy, urologist is Dr. Tresa Endo [facility]  . Hx of malignant neoplasm of prostate 05/08/2016   2012, s/p radical prostatectomy, urologist is Dr. Tresa Endo [facility]  . Insomnia   . Sleep apnea    CPAP  . Varicose vein of leg    removed veins in 1972    Past Surgical History:  Procedure Laterality Date  . artificaill sphincter  12/2015  . COLONOSCOPY  2016  . HERNIA REPAIR  2694   umbilical   . PENILE PROSTHESIS IMPLANT    . PROSTATECTOMY  2012   Radical Prostatectomy.  Marland Kitchen UMBILICAL  HERNIA REPAIR N/A 12/11/2016   Procedure: HERNIA REPAIR UMBILICAL ADULT;  Surgeon: Christene Lye, MD;  Location: [facility] ORS;  Service: General;  Laterality: N/A;  . VARICOSE VEIN SURGERY Left 1972    Family History  Problem Relation Age of Onset  . Cancer Sister        adrenal gland cancer  . Cancer Brother        Prostate CA  . Pneumonia Mother   . Dementia Father   . Cancer Brother        lung (smoker)  . Cancer Sister        colon  . Diabetes Brother   . Irritable bowel syndrome Brother   . Cancer Brother        prostate - in remission    Social History   Tobacco Use  . Smoking status: Never Smoker  . Smokeless tobacco: Never Used  Substance Use Topics  . Alcohol use: Yes    Alcohol/week: 1.2 oz    Types: 2 Glasses of  wine per week    Comment: WINE OCC  . Drug use: No    Outpatient Encounter Medications as of 06/18/2017  Medication Sig  . Flaxseed, Linseed, (FLAX SEED OIL) 1000 MG CAPS Take 1,000 mg by mouth daily.  . fluticasone (FLONASE) 50 MCG/ACT nasal spray Place 1 spray into both nostrils as needed. Reported on 03/21/2015  . gabapentin (NEURONTIN) 300 MG capsule Take 300 mg at bedtime as needed by mouth (nerve pain). Reported on 03/21/2015  . Multiple Vitamins-Minerals (MULTIVITAMIN WITH MINERALS) tablet Take 1 tablet daily by mouth.   Marland Kitchen OVER THE COUNTER MEDICATION Take 1 tablet by mouth daily as needed. Privigin - for memory   . ranitidine (ZANTAC) 150 MG tablet Take 150 mg by mouth every morning.   . traZODone (DESYREL) 50 MG tablet Take 0.5-1 tablets (25-50 mg total) by mouth at bedtime as needed for sleep.  . Turmeric 500 MG CAPS Take 500 mg by mouth daily.  Marland Kitchen aspirin 81 MG tablet Chew 81 mg by mouth.  Marland Kitchen GRAPE SEED EXTRACT PO Take 1 capsule daily by mouth.  . Humidifier MISC   . Melatonin 3 MG TABS Take 3 mg by mouth at bedtime. Reported on 03/21/2015   No facility-administered encounter medications on file as of 06/18/2017.     Functional Ability / Safety Screening 1.  Was the timed Get Up and Go test less than 12 seconds?  yes 2.  Does the patient need help with the phone, transportation, shopping,      preparing meals, housework, laundry, medications, or managing money?  no 3.  Does the patient's home have:  loose throw rugs in the hallway?   no      Grab bars in the bathroom? yes , installed      Handrails on the stairs?   yes      Good lighting?   yes 4.  Has the patient noticed any hearing difficulties?   yes; nothing perceptible  Advanced Directives Does patient have a HCPOA?    yes If yes, name and contact information: Brandon Huffman, [phone] Does patient have a living will or MOST form?  yes Bring copy He would not want to resuscitated if terminal or incurable; in current state of  health, do try   Fall Risk Assessment See under rooming  Depression Screen See under rooming Depression screen [facility] 2/9 06/18/2017 10/29/2016 05/08/2016 03/21/2015  Decreased Interest 0 0 0 -  Down, Depressed, Hopeless 0  0 0 0  PHQ - 2 Score 0 0 0 0    Objective:   Vitals: BP 136/82   Pulse 68   Temp (!) 97.5 F (36.4 C) (Oral)   Resp 12   Ht 6\' 1"  (1.854 m)   Wt 205 lb 8 oz (93.2 kg)   SpO2 97%   BMI 27.11 kg/m  Body mass index is 27.11 kg/m.  Hearing Screening   125Hz  250Hz  500Hz  1000Hz  2000Hz  3000Hz  4000Hz  6000Hz  8000Hz   Right ear:   Pass Pass Fail  Pass    Left ear:   Pass Pass Fail  Pass      Visual Acuity Screening   Right eye Left eye Both eyes  Without correction:     With correction: 20/20 20/20 20/20     Physical Exam  Constitutional: He appears well-developed and well-nourished. No distress.  HENT:  Head: Normocephalic and atraumatic.  Nose: Nose normal.  Mouth/Throat: Oropharynx is clear and moist.  Eyes: EOM are normal. No scleral icterus.  Neck: No JVD present. No thyromegaly present.  Cardiovascular: Normal rate, regular rhythm and normal heart sounds.  Pulmonary/Chest: Effort normal and breath sounds normal. No respiratory distress. He has no wheezes. He has no rales.  Abdominal: Soft. Bowel sounds are normal. He exhibits no distension. There is no tenderness. There is no guarding.  Musculoskeletal: Normal range of motion. He exhibits no edema.  Lymphadenopathy:    He has no cervical adenopathy.  Neurological: He is alert. He displays normal reflexes. He exhibits normal muscle tone. Coordination normal.  Skin: Skin is warm and dry. No rash noted. He is not diaphoretic. No erythema. No pallor.  Psychiatric: He has a normal mood and affect. His behavior is normal. Judgment and thought content normal.   Mood/affect:  euthymic Appearance:  Casually dressed  6CIT Screen 06/18/2017  What Year? 0 points  What month? 0 points  What time? 0 points  Count  back from 20 0 points  Months in reverse 0 points  Repeat phrase 2 points  Total Score 2    Assessment & Plan:     Annual Wellness Visit  Reviewed patient's Family Medical History Reviewed and updated list of patient's medical providers Assessment of cognitive impairment was done Assessed patient's functional ability Established a written schedule for health screening [place] Completed and Reviewed  Exercise Activities and Dietary recommendations Goals    . Increase physical activity (pt-stated)       Immunization History  Administered Date(s) Administered  . Zoster 08/23/2014    Health Maintenance  Topic Date Due  . HIV Screening  06/19/2018 (Originally 03/08/1965)  . PNA vac Low Risk Adult (1 of 2 - PCV13) 06/19/2018 (Originally 03/07/2017)  . INFLUENZA VACCINE  08/12/2017  . COLONOSCOPY  05/28/2019  . TETANUS/TDAP  08/03/2020  . Hepatitis C Screening  Completed    Discussed health benefits of physical activity, and encouraged him to engage in regular exercise appropriate for his age and condition.   No orders of the defined types were placed in this encounter.   Current Outpatient Medications:  .  Flaxseed, Linseed, (FLAX SEED OIL) 1000 MG CAPS, Take 1,000 mg by mouth daily., Disp: , Rfl:  .  fluticasone (FLONASE) 50 MCG/ACT nasal spray, Place 1 spray into both nostrils as needed. Reported on 03/21/2015, Disp: , Rfl:  .  gabapentin (NEURONTIN) 300 MG capsule, Take 300 mg at bedtime as needed by mouth (nerve pain). Reported on 03/21/2015, Disp: , Rfl:  .  Multiple Vitamins-Minerals (MULTIVITAMIN WITH MINERALS) tablet, Take 1 tablet daily by mouth. , Disp: , Rfl:  .  OVER THE COUNTER MEDICATION, Take 1 tablet by mouth daily as needed. Privigin - for memory , Disp: , Rfl:  .  ranitidine (ZANTAC) 150 MG tablet, Take 150 mg by mouth every morning. , Disp: , Rfl:  .  traZODone (DESYREL) 50 MG tablet, Take 0.5-1 tablets (25-50 mg total) by mouth at  bedtime as needed for sleep., Disp: 30 tablet, Rfl: 11 .  Turmeric 500 MG CAPS, Take 500 mg by mouth daily., Disp: , Rfl:  .  aspirin 81 MG tablet, Chew 81 mg by mouth., Disp: , Rfl:  .  GRAPE SEED EXTRACT PO, Take 1 capsule daily by mouth., Disp: , Rfl:  .  Humidifier MISC, , Disp: , Rfl:  .  Melatonin 3 MG TABS, Take 3 mg by mouth at bedtime. Reported on 03/21/2015, Disp: , Rfl:  There are no discontinued medications.  Next Medicare Wellness Visit in 12+ months  Problem List Items Addressed This Visit      Other   Hx of malignant neoplasm of prostate    Check PSA and forward to Dr. Rosana Hoes       Other Visit Diagnoses    Welcome to Medicare preventive visit    -  Primary   Relevant Orders   EKG 12-Lead   Visual acuity screening   Tympanometry

## 2017-07-02 DIAGNOSIS — N393 Stress incontinence (female) (male): Secondary | ICD-10-CM | POA: Diagnosis not present

## 2017-07-02 DIAGNOSIS — C61 Malignant neoplasm of prostate: Secondary | ICD-10-CM | POA: Diagnosis not present

## 2017-07-02 DIAGNOSIS — Z960 Presence of urogenital implants: Secondary | ICD-10-CM | POA: Diagnosis not present

## 2017-07-02 DIAGNOSIS — Z9079 Acquired absence of other genital organ(s): Secondary | ICD-10-CM | POA: Diagnosis not present

## 2017-07-02 DIAGNOSIS — N5231 Erectile dysfunction following radical prostatectomy: Secondary | ICD-10-CM | POA: Diagnosis not present

## 2017-07-02 DIAGNOSIS — Z9689 Presence of other specified functional implants: Secondary | ICD-10-CM | POA: Diagnosis not present

## 2017-07-09 DIAGNOSIS — N5231 Erectile dysfunction following radical prostatectomy: Secondary | ICD-10-CM | POA: Diagnosis not present

## 2017-07-09 DIAGNOSIS — N393 Stress incontinence (female) (male): Secondary | ICD-10-CM | POA: Diagnosis not present

## 2017-07-09 DIAGNOSIS — Z5181 Encounter for therapeutic drug level monitoring: Secondary | ICD-10-CM | POA: Diagnosis not present

## 2017-07-09 DIAGNOSIS — Z8546 Personal history of malignant neoplasm of prostate: Secondary | ICD-10-CM | POA: Diagnosis not present

## 2017-07-09 DIAGNOSIS — E663 Overweight: Secondary | ICD-10-CM | POA: Diagnosis not present

## 2017-07-10 LAB — LIPID PANEL
CHOL/HDL RATIO: 3.9 ratio (ref 0.0–5.0)
CHOLESTEROL TOTAL: 163 mg/dL (ref 100–199)
HDL: 42 mg/dL (ref >39)
LDL Calculated: 94 mg/dL (ref 0–99)
TRIGLYCERIDES: 137 mg/dL (ref 0–149)
VLDL Cholesterol Cal: 27 mg/dL (ref 5–40)

## 2017-07-10 LAB — COMPREHENSIVE METABOLIC PANEL
ALK PHOS: 74 IU/L (ref 39–117)
ALT: 44 IU/L (ref 0–44)
AST: 29 IU/L (ref 0–40)
Albumin/Globulin Ratio: 1.2 (ref 1.2–2.2)
Albumin: 4.4 g/dL (ref 3.6–4.8)
BILIRUBIN TOTAL: 0.3 mg/dL (ref 0.0–1.2)
BUN / CREAT RATIO: 13 (ref 10–24)
BUN: 11 mg/dL (ref 8–27)
CHLORIDE: 104 mmol/L (ref 96–106)
CO2: 23 mmol/L (ref 20–29)
Calcium: 9.7 mg/dL (ref 8.6–10.2)
Creatinine, Ser: 0.86 mg/dL (ref 0.76–1.27)
GFR calc Af Amer: 105 mL/min/{1.73_m2} (ref >59)
GFR calc non Af Amer: 91 mL/min/{1.73_m2} (ref >59)
Globulin, Total: 3.7 g/dL (ref 1.5–4.5)
Glucose: 96 mg/dL (ref 65–99)
Potassium: 5 mmol/L (ref 3.5–5.2)
Sodium: 140 mmol/L (ref 134–144)
Total Protein: 8.1 g/dL (ref 6.0–8.5)

## 2017-07-10 LAB — PSA: Prostate Specific Ag, Serum: 0.1 ng/mL (ref 0.0–4.0)

## 2017-07-11 ENCOUNTER — Other Ambulatory Visit: Payer: Self-pay | Admitting: Family Medicine

## 2017-07-11 DIAGNOSIS — Z5181 Encounter for therapeutic drug level monitoring: Secondary | ICD-10-CM

## 2017-07-11 DIAGNOSIS — Z79899 Other long term (current) drug therapy: Secondary | ICD-10-CM

## 2017-07-11 DIAGNOSIS — Z9189 Other specified personal risk factors, not elsewhere classified: Secondary | ICD-10-CM

## 2017-07-11 MED ORDER — ATORVASTATIN CALCIUM 10 MG PO TABS: 10.0000 mg | ORAL_TABLET | Freq: Every day | ORAL | 1 refills | Status: DC

## 2017-07-11 NOTE — Progress Notes (Signed)
Start statin; recheck cholesterol in 6-8 weeks ASCVD risk was >10%

## 2017-07-22 ENCOUNTER — Ambulatory Visit: Payer: Self-pay

## 2017-07-22 ENCOUNTER — Ambulatory Visit (INDEPENDENT_AMBULATORY_CARE_PROVIDER_SITE_OTHER): Payer: Medicare Other | Admitting: General Surgery

## 2017-07-22 ENCOUNTER — Encounter: Payer: Self-pay | Admitting: General Surgery

## 2017-07-22 VITALS — BP 118/62 | HR 60 | Resp 12 | Ht 73.0 in | Wt 209.0 lb

## 2017-07-22 DIAGNOSIS — K439 Ventral hernia without obstruction or gangrene: Secondary | ICD-10-CM

## 2017-07-22 DIAGNOSIS — K429 Umbilical hernia without obstruction or gangrene: Secondary | ICD-10-CM

## 2017-07-22 NOTE — Progress Notes (Signed)
Patient ID: Brandon Huffman, male   DOB: 1950-08-28, 67 y.o.   MRN: [id number]  Chief Complaint  Patient presents with  . Hernia    HPI Brandon Huffman is a 67 y.o. male.  Here for evaluation of a possible hernia referred by Dr Brandon Huffman, former patient of Dr Brandon Huffman. He states he does notice occasional pain" dull ache" with exercise, burning sensation. CT was 11-06-16.  HPI  Past Medical History:  Diagnosis Date  . Adult ADHD    Dr. Rosine Huffman  . Allergy   . Cancer ([place]) 2012   PROSTATE  . Family hx of colon cancer 05/08/2016  . GERD (gastroesophageal reflux disease)   . History of implantation of artificial sphincter   . Hx of malignant neoplasm of prostate 05/08/2016   2012, s/p radical prostatectomy, urologist is Dr. Tresa Endo [facility]  . Hx of malignant neoplasm of prostate 05/08/2016   2012, s/p radical prostatectomy, urologist is Dr. Tresa Endo [facility]  . Insomnia   . Sleep apnea    CPAP  . Varicose vein of leg    removed veins in 1972    Past Surgical History:  Procedure Laterality Date  . artificaill sphincter  12/2015  . COLONOSCOPY  2016  . HERNIA REPAIR  2951   umbilical   . PENILE PROSTHESIS IMPLANT    . PROSTATECTOMY  2012   Radical Prostatectomy.  Marland Kitchen UMBILICAL HERNIA REPAIR N/A 12/11/2016   Procedure: HERNIA REPAIR UMBILICAL ADULT;  Surgeon: Brandon Lye, MD;  Location: [facility] ORS;  Service: General;  Laterality: N/A;  . VARICOSE VEIN SURGERY Left 1972    Family History  Problem Relation Age of Onset  . Cancer Sister        adrenal gland cancer  . Cancer Brother        Prostate CA  . Pneumonia Mother   . Dementia Father   . Cancer Brother        lung (smoker)  . Cancer Sister        colon  . Diabetes Brother   . Irritable bowel syndrome Brother   . Cancer Brother        prostate - in remission    Social History Social History   Tobacco Use  . Smoking status: Never Smoker  . Smokeless tobacco: Never Used  Substance Use Topics  . Alcohol  use: Yes    Alcohol/week: 1.2 oz    Types: 2 Glasses of wine per week    Comment: WINE OCC  . Drug use: No    Allergies  Allergen Reactions  . Chocolate Flavor Other (See Comments)    Cysts under the skin.  . Tetracyclines & Related Hives and Rash    Current Outpatient Medications  Medication Sig Dispense Refill  . atorvastatin (LIPITOR) 10 MG tablet Take 1 tablet (10 mg total) by mouth at bedtime. 30 tablet 1  . Flaxseed, Linseed, (FLAX SEED OIL) 1000 MG CAPS Take 1,000 mg by mouth daily.    . fluticasone (FLONASE) 50 MCG/ACT nasal spray Place 1 spray into both nostrils as needed. Reported on 03/21/2015    . gabapentin (NEURONTIN) 300 MG capsule Take 300 mg at bedtime as needed by mouth (nerve pain). Reported on 03/21/2015    . GRAPE SEED EXTRACT PO Take 1 capsule daily by mouth.    . Humidifier MISC     . Melatonin 3 MG TABS Take 3 mg by mouth at bedtime. Reported on 03/21/2015    . Multiple  Vitamins-Minerals (MULTIVITAMIN WITH MINERALS) tablet Take 1 tablet daily by mouth.     Marland Kitchen OVER THE COUNTER MEDICATION Take 1 tablet by mouth daily as needed. Privigin - for memory     . ranitidine (ZANTAC) 150 MG tablet Take 150 mg by mouth every morning.     . traZODone (DESYREL) 50 MG tablet Take 0.5-1 tablets (25-50 mg total) by mouth at bedtime as needed for sleep. 30 tablet 11  . Turmeric 500 MG CAPS Take 500 mg by mouth daily.     No current facility-administered medications for this visit.     Review of Systems Review of Systems  Constitutional: Negative.   Respiratory: Negative.   Cardiovascular: Negative.   Gastrointestinal: Negative for constipation and diarrhea.    Blood pressure 118/62, pulse 60, resp. rate 12, height 6\' 1"  (1.854 m), weight 209 lb (94.8 kg), SpO2 97 %.  Physical Exam Physical Exam  Constitutional: He appears well-developed and well-nourished.  Abdominal: Soft. Normal appearance. No hernia.    Neurological: He is alert.  Skin: Skin is warm and dry.     Data Reviewed Operative report from December 11, 1789 umbilical hernia repair showed a very small fascial defect at the umbilicus repaired with a single 0 Prolene suture.  The defect was estimated at 5 mm in diameter.  Ultrasound examination of the scar above the umbilicus at the site of the 2018 ventral hernia repair showed a small fascial defect with fat moving when the patient elevates his head and shoulders off the examining table through the defect.  Defect itself is about 4 cm above the umbilicus and less than a centimeter in diameter.  Assessment    Small recurrent fascial defect along the course of the previous incision from his prostatectomy in 2012.    Plan    At this time, the patient is minimally symptomatic.  Should he have increasing discomfort or notice a more pronounced bulge would discuss elective repair. Follow up as needed.       HPI, Physical Exam, Assessment and Plan have been scribed under the direction and in the presence of Brandon Bellow, MD. Brandon Fetch, RN I have completed the exam and reviewed the above documentation for accuracy and completeness.  I agree with the above.  Haematologist has been used and any errors in dictation or transcription are unintentional.  Brandon Huffman, M.D., F.A.C.S.   Brandon Huffman 07/23/2017, 3:46 PM

## 2017-07-22 NOTE — Patient Instructions (Signed)
The patient is aware to call back for any questions or concerns.  

## 2017-07-23 DIAGNOSIS — K439 Ventral hernia without obstruction or gangrene: Secondary | ICD-10-CM | POA: Insufficient documentation

## 2017-07-30 ENCOUNTER — Other Ambulatory Visit: Payer: Self-pay | Admitting: Family Medicine

## 2017-07-30 ENCOUNTER — Telehealth: Payer: Self-pay | Admitting: Family Medicine

## 2017-07-30 MED ORDER — PANTOPRAZOLE SODIUM 40 MG PO TBEC: 40.0000 mg | DELAYED_RELEASE_TABLET | Freq: Every day | ORAL | 5 refills | Status: DC

## 2017-07-30 NOTE — Telephone Encounter (Signed)
Copied from [place] [id number]. Topic: Quick Communication - Rx Refill/Question >> Jul 30, 2017  2:35 PM Bea Graff, NT wrote: Medication: pantoprazole (PROTONIX) 40 MG tablet   Has the patient contacted their pharmacy? Yes.   (Agent: If no, request that the patient contact the pharmacy for the refill.) (Agent: If yes, when and what did the pharmacy advise?)  Preferred Pharmacy (with phone number or street name): [id number] [address], [place] [phone] (Phone) [phone] (Fax)      Agent: Please be advised that RX refills may take up to 3 business days. We ask that you follow-up with your pharmacy.

## 2017-07-30 NOTE — Telephone Encounter (Signed)
Pt states the zantac is not effective, please advise

## 2017-07-30 NOTE — Telephone Encounter (Signed)
Thank you New Rx sent CMA did not think he needed call back, so I'm closing the note

## 2017-07-30 NOTE — Progress Notes (Signed)
Rx printed; shredded; new Rx sent electronically

## 2017-09-08 ENCOUNTER — Telehealth: Payer: Self-pay | Admitting: Nurse Practitioner

## 2017-09-08 ENCOUNTER — Other Ambulatory Visit: Payer: Self-pay | Admitting: Family Medicine

## 2017-09-08 DIAGNOSIS — Z5181 Encounter for therapeutic drug level monitoring: Secondary | ICD-10-CM

## 2017-09-08 DIAGNOSIS — Z79899 Other long term (current) drug therapy: Secondary | ICD-10-CM

## 2017-09-08 DIAGNOSIS — Z9189 Other specified personal risk factors, not elsewhere classified: Secondary | ICD-10-CM

## 2017-09-08 NOTE — Telephone Encounter (Signed)
Left detailed voicemail

## 2017-09-08 NOTE — Addendum Note (Signed)
Addended by: Docia Furl on: 09/08/2017 02:29 PM   Modules accepted: Orders

## 2017-09-08 NOTE — Telephone Encounter (Signed)
Last appt:6/7

## 2017-09-08 NOTE — Telephone Encounter (Signed)
Received pharmacy request for atorvastatin- has the patient been taking this medication? If so we would like him to come in for labs only appointment to recheck after 6 weeks of being on medication. I will go ahead and send this refill.

## 2017-09-21 ENCOUNTER — Telehealth: Payer: Self-pay

## 2017-09-21 ENCOUNTER — Encounter: Payer: Self-pay | Admitting: Family Medicine

## 2017-09-21 DIAGNOSIS — R1310 Dysphagia, unspecified: Secondary | ICD-10-CM

## 2017-09-21 NOTE — Telephone Encounter (Signed)
Called pt back states he is having a lot of epigastric pain after he eats and would like to see [facility] GI Dr. Alain Marion

## 2017-09-21 NOTE — Telephone Encounter (Signed)
Copied from [place] [id number]. Topic: Referral - Request >> Sep 21, 2017  9:37 AM Yvette Rack wrote: Reason for CRM: Pt requests referral to a GI . Pt states he would like to see the same provider that did his colonoscopy.

## 2017-09-21 NOTE — Telephone Encounter (Signed)
Patient sent a MyChart note and I just responded to it after putting in referral; see MyChart note

## 2017-10-08 DIAGNOSIS — Z6824 Body mass index (BMI) 24.0-24.9, adult: Secondary | ICD-10-CM | POA: Diagnosis not present

## 2017-10-08 DIAGNOSIS — R1013 Epigastric pain: Secondary | ICD-10-CM | POA: Diagnosis not present

## 2017-10-08 DIAGNOSIS — K219 Gastro-esophageal reflux disease without esophagitis: Secondary | ICD-10-CM | POA: Diagnosis not present

## 2017-10-08 DIAGNOSIS — R1310 Dysphagia, unspecified: Secondary | ICD-10-CM | POA: Diagnosis not present

## 2017-11-18 DIAGNOSIS — K2930 Chronic superficial gastritis without bleeding: Secondary | ICD-10-CM | POA: Diagnosis not present

## 2017-11-18 DIAGNOSIS — K317 Polyp of stomach and duodenum: Secondary | ICD-10-CM | POA: Diagnosis not present

## 2017-11-18 DIAGNOSIS — K449 Diaphragmatic hernia without obstruction or gangrene: Secondary | ICD-10-CM | POA: Diagnosis not present

## 2017-11-18 DIAGNOSIS — K210 Gastro-esophageal reflux disease with esophagitis: Secondary | ICD-10-CM | POA: Diagnosis not present

## 2017-11-18 DIAGNOSIS — R1310 Dysphagia, unspecified: Secondary | ICD-10-CM | POA: Diagnosis not present

## 2017-11-26 ENCOUNTER — Other Ambulatory Visit: Payer: Self-pay | Admitting: Family Medicine

## 2017-12-03 DIAGNOSIS — Z23 Encounter for immunization: Secondary | ICD-10-CM | POA: Diagnosis not present

## 2017-12-03 DIAGNOSIS — K210 Gastro-esophageal reflux disease with esophagitis: Secondary | ICD-10-CM | POA: Diagnosis not present

## 2017-12-03 DIAGNOSIS — Z6824 Body mass index (BMI) 24.0-24.9, adult: Secondary | ICD-10-CM | POA: Diagnosis not present

## 2018-01-03 ENCOUNTER — Encounter: Payer: Self-pay | Admitting: Family Medicine

## 2018-01-03 ENCOUNTER — Ambulatory Visit (INDEPENDENT_AMBULATORY_CARE_PROVIDER_SITE_OTHER): Payer: Medicare Other | Admitting: Family Medicine

## 2018-01-03 VITALS — BP 122/80 | HR 81 | Temp 97.9°F | Ht 73.0 in | Wt 201.3 lb

## 2018-01-03 DIAGNOSIS — E8809 Other disorders of plasma-protein metabolism, not elsewhere classified: Secondary | ICD-10-CM

## 2018-01-03 DIAGNOSIS — E782 Mixed hyperlipidemia: Secondary | ICD-10-CM | POA: Diagnosis not present

## 2018-01-03 DIAGNOSIS — Z8261 Family history of arthritis: Secondary | ICD-10-CM

## 2018-01-03 DIAGNOSIS — R718 Other abnormality of red blood cells: Secondary | ICD-10-CM | POA: Diagnosis not present

## 2018-01-03 DIAGNOSIS — R779 Abnormality of plasma protein, unspecified: Secondary | ICD-10-CM | POA: Insufficient documentation

## 2018-01-03 DIAGNOSIS — Z5181 Encounter for therapeutic drug level monitoring: Secondary | ICD-10-CM

## 2018-01-03 DIAGNOSIS — E785 Hyperlipidemia, unspecified: Secondary | ICD-10-CM | POA: Insufficient documentation

## 2018-01-03 DIAGNOSIS — M25541 Pain in joints of right hand: Secondary | ICD-10-CM

## 2018-01-03 NOTE — Patient Instructions (Addendum)
On or after December 04, 2018, you will be eligible for the Prevnar vaccine (PCV-13)  Please have labs done at [place] If you have not heard anything from my staff in a week about any orders/referrals/studies from today, please contact us here to follow-up [phone]  Try to limit saturated fats in your diet (bologna, hot dogs, barbeque, cheeseburgers, hamburgers, steak, bacon, sausage, cheese, etc.) and get more fresh fruits, vegetables, and whole grains

## 2018-01-03 NOTE — Assessment & Plan Note (Signed)
Check labs 

## 2018-01-03 NOTE — Progress Notes (Signed)
BP 122/80   Pulse 81   Temp 97.9 F (36.6 C) (Oral)   Ht 6\' 1"  (1.854 m)   Wt 201 lb 4.8 oz (91.3 kg)   SpO2 95%   BMI 26.56 kg/m    Subjective:    Patient ID: Brandon Huffman, male    DOB: Mar 11, 1950, 67 y.o.   MRN: [id number]  HPI: Brandon Huffman is a 67 y.o. male  Chief Complaint  Patient presents with  . Follow-up  . Medication Refill    HPI Patient is here for f/u  High cholesterol Last ASCVD risk was 10% Not any issues to his knowledge re: cholesterol in the family I asked about diet; he tries to not eat much pork or red meat; mostly fish and chicken Maybe red meat once a week He does like his eggs; probably 3 per week; had one this morning, french toast Loves cheese, cheddar aged Does eat oatmeal  Lab Results  Component Value Date   CHOL 163 07/09/2017   HDL 42 07/09/2017   LDLCALC 94 07/09/2017   TRIG 137 07/09/2017   CHOLHDL 3.9 07/09/2017   Reviewed labs from Sept from [facility] CBC normal except for one point low MCV Protein elevated, globulin were high Brother has rheumatoid arthritis; on infusion Started to have trouble with pinky joint, right hand; right thumb MCP  He got a pneumonia vaccine at Dr. Janet Berlin office when he had the endoscopy; they gave him the PPSV-23  He just had the PSA done through [facility] on Friday; urinating fine  Depression screen [facility] 2/9 01/03/2018 06/18/2017 10/29/2016 05/08/2016 03/21/2015  Decreased Interest 0 0 0 0 -  Down, Depressed, Hopeless 0 0 0 0 0  PHQ - 2 Score 0 0 0 0 0  Altered sleeping 0 - - - -  Tired, decreased energy 0 - - - -  Change in appetite 0 - - - -  Feeling bad or failure about yourself  0 - - - -  Trouble concentrating 0 - - - -  Moving slowly or fidgety/restless 0 - - - -  Suicidal thoughts 0 - - - -  PHQ-9 Score 0 - - - -  Difficult doing work/chores Not difficult at all - - - -   Fall Risk  01/03/2018 06/18/2017 10/29/2016 05/08/2016 03/21/2015  Falls in the past year? 0 No No No No  Number falls in  past yr: 0 - - - -  Injury with Fall? 0 - - - -    Relevant past medical, surgical, family and social history reviewed Past Medical History:  Diagnosis Date  . Adult ADHD    Dr. Rosine Door  . Allergy   . Cancer ([place]) 2012   PROSTATE  . Family hx of colon cancer 05/08/2016  . GERD (gastroesophageal reflux disease)   . History of implantation of artificial sphincter   . Hx of malignant neoplasm of prostate 05/08/2016   2012, s/p radical prostatectomy, urologist is Dr. Tresa Endo [facility]  . Hx of malignant neoplasm of prostate 05/08/2016   2012, s/p radical prostatectomy, urologist is Dr. Tresa Endo [facility]  . Insomnia   . Sleep apnea    CPAP  . Varicose vein of leg    removed veins in 1972   Past Surgical History:  Procedure Laterality Date  . artificaill sphincter  12/2015  . COLONOSCOPY  2016  . HERNIA REPAIR  3329   umbilical   . PENILE PROSTHESIS IMPLANT    .  PROSTATECTOMY  2012   Radical Prostatectomy.  Marland Kitchen UMBILICAL HERNIA REPAIR N/A 12/11/2016   Procedure: HERNIA REPAIR UMBILICAL ADULT;  Surgeon: Christene Lye, MD;  Location: [facility] ORS;  Service: General;  Laterality: N/A;  . VARICOSE VEIN SURGERY Left 1972   Family History  Problem Relation Age of Onset  . Cancer Sister        adrenal gland cancer  . Cancer Brother        Prostate CA  . Pneumonia Mother   . Dementia Father   . Cancer Brother        lung (smoker)  . Cancer Sister        colon  . Diabetes Brother   . Irritable bowel syndrome Brother   . Cancer Brother        prostate - in remission   Social History   Tobacco Use  . Smoking status: Never Smoker  . Smokeless tobacco: Never Used  Substance Use Topics  . Alcohol use: Yes    Alcohol/week: 2.0 standard drinks    Types: 2 Glasses of wine per week    Comment: WINE OCC  . Drug use: No     Office Visit from 01/03/2018 in [place]  AUDIT-C Score  2      Interim medical history since last visit  reviewed. Allergies and medications reviewed  Review of Systems Per HPI unless specifically indicated above     Objective:    BP 122/80   Pulse 81   Temp 97.9 F (36.6 C) (Oral)   Ht 6\' 1"  (1.854 m)   Wt 201 lb 4.8 oz (91.3 kg)   SpO2 95%   BMI 26.56 kg/m   Wt Readings from Last 3 Encounters:  01/03/18 201 lb 4.8 oz (91.3 kg)  07/22/17 209 lb (94.8 kg)  06/18/17 205 lb 8 oz (93.2 kg)    Physical Exam Constitutional:      General: He is not in acute distress.    Appearance: He is well-developed.  HENT:     Head: Normocephalic and atraumatic.  Eyes:     General: No scleral icterus. Neck:     Thyroid: No thyromegaly.  Cardiovascular:     Rate and Rhythm: Normal rate and regular rhythm.  Pulmonary:     Effort: Pulmonary effort is normal.     Breath sounds: Normal breath sounds.  Abdominal:     General: Bowel sounds are normal. There is no distension.     Palpations: Abdomen is soft.  Musculoskeletal:     Right hand: He exhibits tenderness and deformity.       Hands:  Skin:    General: Skin is warm and dry.     Coloration: Skin is not pale.  Neurological:     Coordination: Coordination normal.  Psychiatric:        Behavior: Behavior normal.        Thought Content: Thought content normal.        Judgment: Judgment normal.     Results for orders placed or performed in visit on 06/18/17  Comprehensive metabolic panel  Result Value Ref Range   Glucose 96 65 - 99 mg/dL   BUN 11 8 - 27 mg/dL   Creatinine, Ser 0.86 0.76 - 1.27 mg/dL   GFR calc non Af Amer 91 >59 mL/min/1.73   GFR calc Af Amer 105 >59 mL/min/1.73   BUN/Creatinine Ratio 13 10 - 24   Sodium 140 134 - 144 mmol/L  Potassium 5.0 3.5 - 5.2 mmol/L   Chloride 104 96 - 106 mmol/L   CO2 23 20 - 29 mmol/L   Calcium 9.7 8.6 - 10.2 mg/dL   Total Protein 8.1 6.0 - 8.5 g/dL   Albumin 4.4 3.6 - 4.8 g/dL   Globulin, Total 3.7 1.5 - 4.5 g/dL   Albumin/Globulin Ratio 1.2 1.2 - 2.2   Bilirubin Total 0.3 0.0  - 1.2 mg/dL   Alkaline Phosphatase 74 39 - 117 IU/L   AST 29 0 - 40 IU/L   ALT 44 0 - 44 IU/L  Lipid panel  Result Value Ref Range   Cholesterol, Total 163 100 - 199 mg/dL   Triglycerides 137 0 - 149 mg/dL   HDL 42 >39 mg/dL   VLDL Cholesterol Cal 27 5 - 40 mg/dL   LDL Calculated 94 0 - 99 mg/dL   Chol/HDL Ratio 3.9 0.0 - 5.0 ratio  PSA  Result Value Ref Range   Prostate Specific Ag, Serum 0.1 0.0 - 4.0 ng/mL      Assessment & Plan:   Problem List Items Addressed This Visit      Other   Medication monitoring encounter   Hyperlipidemia - Primary    Check lipids; he will have these done at [place] and go fasting      Relevant Orders   Lipid panel   Family history of rheumatoid arthritis    Check labs      Relevant Orders   ANA,IFA RA Diag Pnl w/rflx Tit/Patn   Elevated blood protein    Check labs       Other Visit Diagnoses    Microcytosis       MCV just one point low 3 months ago in [facility] labs; will recheck CBC and iron studies   Relevant Orders   CBC with Differential/Platelet   Iron, TIBC and Ferritin Panel   Hyperproteinemia       Relevant Orders   Protein Electrophoresis, (serum)   IFE+PROTEIN ELECTRO, 24-HR UR   Hepatic function panel   Joint pain in fingers of right hand       check labs; I suspect this is OA, not RA   Relevant Orders   ANA,IFA RA Diag Pnl w/rflx Tit/Patn       Follow up plan: Return in about 6 months (around 07/05/2018).  An after-visit summary was printed and given to the patient at [facility].  Please see the patient instructions which may contain other information and recommendations beyond what is mentioned above in the assessment and plan.  No orders of the defined types were placed in this encounter.   Orders Placed This Encounter  Procedures  . Protein Electrophoresis, (serum)  . IFE+PROTEIN ELECTRO, 24-HR UR  . Hepatic function panel  . Lipid panel  . CBC with Differential/Platelet  . Iron, TIBC and  Ferritin Panel  . ANA,IFA RA Diag Pnl w/rflx Tit/Patn

## 2018-01-03 NOTE — Assessment & Plan Note (Addendum)
Check lipids; he will have these done at [place] and go fasting

## 2018-01-06 NOTE — Progress Notes (Signed)
Brandon Huffman, please let the patient know that his rheumatoid factor test was negative; there are several other labs that are pending; [name] found a second set of lab orders on the printer after he left; did he get those? If not, please contact [facility] and see if they can be added on to the blood they drew to avoid a 2nd venipuncture; thank you

## 2018-01-07 LAB — ANA,IFA RA DIAG PNL W/RFLX TIT/PATN
ANA Titer 1: NEGATIVE
Cyclic Citrullin Peptide Ab: 9 units (ref 0–19)
Rheumatoid fact SerPl-aCnc: <10 IU/mL (ref 0.0–13.9)

## 2018-01-07 NOTE — Progress Notes (Signed)
Joelene Millin, please see previous note; ANA is negative, rheumatoid factor is negative, CCP is negative; all good news; please see about the other labs

## 2018-01-10 ENCOUNTER — Encounter: Payer: Self-pay | Admitting: Family Medicine

## 2018-01-10 DIAGNOSIS — E782 Mixed hyperlipidemia: Secondary | ICD-10-CM

## 2018-01-10 LAB — CBC WITH DIFFERENTIAL/PLATELET
BASOS ABS: 0 10*3/uL (ref 0.0–0.2)
Basos: 1 %
EOS (ABSOLUTE): 0.1 10*3/uL (ref 0.0–0.4)
Eos: 3 %
Hematocrit: 41.9 % (ref 37.5–51.0)
Hemoglobin: 13.9 g/dL (ref 13.0–17.7)
IMMATURE GRANULOCYTES: 0 %
Immature Grans (Abs): 0 10*3/uL (ref 0.0–0.1)
Lymphocytes Absolute: 1.2 10*3/uL (ref 0.7–3.1)
Lymphs: 26 %
MCH: 28.8 pg (ref 26.6–33.0)
MCHC: 33.2 g/dL (ref 31.5–35.7)
MCV: 87 fL (ref 79–97)
Monocytes Absolute: 0.4 10*3/uL (ref 0.1–0.9)
Monocytes: 9 %
NEUTROS PCT: 61 %
Neutrophils Absolute: 3 10*3/uL (ref 1.4–7.0)
Platelets: 173 10*3/uL (ref 150–450)
RBC: 4.83 x10E6/uL (ref 4.14–5.80)
RDW: 14.8 % (ref 12.3–15.4)
WBC: 4.8 10*3/uL (ref 3.4–10.8)

## 2018-01-10 LAB — LIPID PANEL
CHOL/HDL RATIO: 3.8 ratio (ref 0.0–5.0)
Cholesterol, Total: 160 mg/dL (ref 100–199)
HDL: 42 mg/dL (ref >39)
LDL Calculated: 97 mg/dL (ref 0–99)
Triglycerides: 106 mg/dL (ref 0–149)
VLDL Cholesterol Cal: 21 mg/dL (ref 5–40)

## 2018-01-10 LAB — PROTEIN ELECTROPHORESIS, SERUM
A/G Ratio: 1.1 (ref 0.7–1.7)
ALPHA 2: 0.6 g/dL (ref 0.4–1.0)
Albumin ELP: 3.9 g/dL (ref 2.9–4.4)
Alpha 1: 0.2 g/dL (ref 0.0–0.4)
Beta: 1.1 g/dL (ref 0.7–1.3)
GAMMA GLOBULIN: 1.9 g/dL — AB (ref 0.4–1.8)
GLOBULIN, TOTAL: 3.7 g/dL (ref 2.2–3.9)
Total Protein: 7.6 g/dL (ref 6.0–8.5)

## 2018-01-10 LAB — IRON,TIBC AND FERRITIN PANEL
Ferritin: 51 ng/mL (ref 30–400)
IRON: 74 ug/dL (ref 38–169)
Iron Saturation: 23 % (ref 15–55)
Total Iron Binding Capacity: 322 ug/dL (ref 250–450)
UIBC: 248 ug/dL (ref 111–343)

## 2018-01-10 LAB — HEPATIC FUNCTION PANEL
ALT: 31 IU/L (ref 0–44)
AST: 30 IU/L (ref 0–40)
Albumin: 4.2 g/dL (ref 3.6–4.8)
Alkaline Phosphatase: 77 IU/L (ref 39–117)
BILIRUBIN TOTAL: 0.3 mg/dL (ref 0.0–1.2)
Bilirubin, Direct: 0.07 mg/dL (ref 0.00–0.40)

## 2018-01-10 LAB — IFE+PROTEIN ELECTRO, 24-HR UR
% BETA, URINE: 18.1 %
ALBUMIN, U: 58.7 %
ALPHA 1 URINE: 1 %
ALPHA-2-GLOBULIN, U: 6.6 %
GAMMA GLOBULIN URINE: 15.6 %
PROTEIN UR: 19.5 mg/dL
Protein, 24H Urine: 302 mg/24 hr — ABNORMAL HIGH (ref 30–150)

## 2018-01-10 MED ORDER — ATORVASTATIN CALCIUM 20 MG PO TABS: 20.0000 mg | ORAL_TABLET | Freq: Every day | ORAL | 1 refills | Status: DC

## 2018-01-11 ENCOUNTER — Other Ambulatory Visit: Payer: Self-pay | Admitting: Family Medicine

## 2018-01-11 DIAGNOSIS — R809 Proteinuria, unspecified: Secondary | ICD-10-CM

## 2018-01-11 DIAGNOSIS — R771 Abnormality of globulin: Secondary | ICD-10-CM

## 2018-01-20 ENCOUNTER — Encounter: Payer: Self-pay | Admitting: Family Medicine

## 2018-01-21 ENCOUNTER — Other Ambulatory Visit: Payer: Self-pay

## 2018-01-21 DIAGNOSIS — R809 Proteinuria, unspecified: Secondary | ICD-10-CM

## 2018-01-21 NOTE — Telephone Encounter (Signed)
Order for routine urinalysis in already order, you can just release and let him know to pick that up; please answer his MyChart note

## 2018-01-24 LAB — URINALYSIS, ROUTINE W REFLEX MICROSCOPIC
BILIRUBIN UA: NEGATIVE
GLUCOSE, UA: NEGATIVE
Ketones, UA: NEGATIVE
Leukocytes, UA: NEGATIVE
Nitrite, UA: NEGATIVE
RBC, UA: NEGATIVE
Specific Gravity, UA: 1.023 (ref 1.005–1.030)
UUROB: 0.2 mg/dL (ref 0.2–1.0)
pH, UA: 6 (ref 5.0–7.5)

## 2018-02-02 ENCOUNTER — Other Ambulatory Visit: Payer: Self-pay | Admitting: Family Medicine

## 2018-02-02 NOTE — Telephone Encounter (Signed)
I just prescribed a 2 month supply of statin on 01/10/18 I have received another request for a 30 day supply with 1 refill That's exactly what I prescribed just a few weeks ago  DENIED  He should not need another refill until late February and AFTER we have gotten labs to see if the dose is correct

## 2018-02-08 ENCOUNTER — Telehealth: Payer: Self-pay

## 2018-02-08 NOTE — Telephone Encounter (Signed)
Pt notified.   Copied from [place] [phone]. Topic: General - Inquiry >> Brandon Huffman, Brandon Huffman  9:12 AM Brandon Huffman wrote: Reason for CRM: Patient would like to know if it will be okay to move up his appt for 6/26. He said he would really like to come in the office in March or April but is unsure if Dr Sanda Klein would want to see him that early. Please advise. He said his voicemail is confidential if you need to leave a detailed message.

## 2018-02-08 NOTE — Telephone Encounter (Signed)
Yes, of course, I'll be glad to see him

## 2018-02-08 NOTE — Telephone Encounter (Unsigned)
Copied from [place] [phone]. Topic: General - Inquiry >> Feb 08, 2018  9:12 AM Ahmed Prima L wrote: Reason for CRM: Patient would like to know if it will be okay to move up his appt for 6/26. He said he would really like to come in the office in March or April but is unsure if Dr Sanda Klein would want to see him that early. Please advise. He said his voicemail is confidential if you need to leave a detailed message.

## 2018-03-09 ENCOUNTER — Encounter: Payer: Self-pay | Admitting: Family Medicine

## 2018-03-09 DIAGNOSIS — R809 Proteinuria, unspecified: Secondary | ICD-10-CM

## 2018-03-09 NOTE — Telephone Encounter (Signed)
Copied from [place] [phone]. Topic: General - Other >> Mar 09, 2018  3:17 PM Virl Axe D wrote: Reason for CRM: Pt would like to know if he needs to keep his appointment with the kidney specialist for Friday since all the testing he had done came back normal. He would like a callback Please advise. [phone]

## 2018-03-10 NOTE — Addendum Note (Signed)
Addended by: LADA, Satira Anis on: 03/10/2018 10:38 AM   Modules accepted: Orders

## 2018-03-11 ENCOUNTER — Encounter: Payer: Self-pay | Admitting: Family Medicine

## 2018-03-11 DIAGNOSIS — R809 Proteinuria, unspecified: Secondary | ICD-10-CM

## 2018-03-11 HISTORY — DX: Proteinuria, unspecified: R80.9

## 2018-07-01 ENCOUNTER — Encounter: Payer: Self-pay | Admitting: Internal Medicine

## 2018-07-01 ENCOUNTER — Ambulatory Visit (INDEPENDENT_AMBULATORY_CARE_PROVIDER_SITE_OTHER): Payer: Medicare (Managed Care) | Admitting: Internal Medicine

## 2018-07-01 ENCOUNTER — Other Ambulatory Visit: Payer: Self-pay

## 2018-07-01 DIAGNOSIS — E559 Vitamin D deficiency, unspecified: Secondary | ICD-10-CM

## 2018-07-01 DIAGNOSIS — Z1159 Encounter for screening for other viral diseases: Secondary | ICD-10-CM

## 2018-07-01 DIAGNOSIS — Z0000 Encounter for general adult medical examination without abnormal findings: Secondary | ICD-10-CM

## 2018-07-01 DIAGNOSIS — K219 Gastro-esophageal reflux disease without esophagitis: Secondary | ICD-10-CM | POA: Insufficient documentation

## 2018-07-01 DIAGNOSIS — E8809 Other disorders of plasma-protein metabolism, not elsewhere classified: Secondary | ICD-10-CM

## 2018-07-01 DIAGNOSIS — Z13818 Encounter for screening for other digestive system disorders: Secondary | ICD-10-CM

## 2018-07-01 DIAGNOSIS — K449 Diaphragmatic hernia without obstruction or gangrene: Secondary | ICD-10-CM | POA: Insufficient documentation

## 2018-07-01 DIAGNOSIS — G4730 Sleep apnea, unspecified: Secondary | ICD-10-CM

## 2018-07-01 DIAGNOSIS — G4700 Insomnia, unspecified: Secondary | ICD-10-CM

## 2018-07-01 DIAGNOSIS — R779 Abnormality of plasma protein, unspecified: Secondary | ICD-10-CM

## 2018-07-01 DIAGNOSIS — K317 Polyp of stomach and duodenum: Secondary | ICD-10-CM | POA: Insufficient documentation

## 2018-07-01 DIAGNOSIS — E785 Hyperlipidemia, unspecified: Secondary | ICD-10-CM

## 2018-07-01 DIAGNOSIS — Z1329 Encounter for screening for other suspected endocrine disorder: Secondary | ICD-10-CM

## 2018-07-01 LAB — HM HEPATITIS C SCREENING LAB: HM Hepatitis Screen: NEGATIVE

## 2018-07-01 MED ORDER — ZOLPIDEM TARTRATE 5 MG PO TABS: 5.0000 mg | ORAL_TABLET | Freq: Every evening | ORAL | 5 refills | Status: DC | PRN

## 2018-07-01 MED ORDER — PANTOPRAZOLE SODIUM 40 MG PO TBEC: 40.0000 mg | DELAYED_RELEASE_TABLET | Freq: Every day | ORAL | 3 refills | Status: DC

## 2018-07-01 NOTE — Patient Instructions (Addendum)
prevnar due 12/04/18 call the office for a nurse visit please  Take care and be safe   Pneumococcal Conjugate Vaccine suspension for injection What is this medicine? PNEUMOCOCCAL VACCINE (NEU mo KOK al vak SEEN) is a vaccine used to prevent pneumococcus bacterial infections. These bacteria can cause serious infections like pneumonia, meningitis, and blood infections. This vaccine will lower your chance of getting pneumonia. If you do get pneumonia, it can make your symptoms milder and your illness shorter. This vaccine will not treat an infection and will not cause infection. This vaccine is recommended for infants and young children, adults with certain medical conditions, and adults 60 years or older. This medicine may be used for other purposes; ask your health care provider or pharmacist if you have questions. COMMON BRAND NAME(S): Prevnar, Prevnar 13 What should I tell my health care provider before I take this medicine? They need to know if you have any of these conditions: -bleeding problems -fever -immune system problems -an unusual or allergic reaction to pneumococcal vaccine, diphtheria toxoid, other vaccines, latex, other medicines, foods, dyes, or preservatives -pregnant or trying to get pregnant -breast-feeding How should I use this medicine? This vaccine is for injection into a muscle. It is given by a health care professional. A copy of Vaccine Information Statements will be given before each vaccination. Read this sheet carefully each time. The sheet may change frequently. Talk to your pediatrician regarding the use of this medicine in children. While this drug may be prescribed for children as young as 56 weeks old for selected conditions, precautions do apply. Overdosage: If you think you have taken too much of this medicine contact a poison control center or emergency room at once. NOTE: This medicine is only for you. Do not share this medicine with others. What if I miss a  dose? It is important not to miss your dose. Call your doctor or health care professional if you are unable to keep an appointment. What may interact with this medicine? -medicines for cancer chemotherapy -medicines that suppress your immune function -steroid medicines like prednisone or cortisone This list may not describe all possible interactions. Give your health care provider a list of all the medicines, herbs, non-prescription drugs, or dietary supplements you use. Also tell them if you smoke, drink alcohol, or use illegal drugs. Some items may interact with your medicine. What should I watch for while using this medicine? Mild fever and pain should go away in 3 days or less. Report any unusual symptoms to your doctor or health care professional. What side effects may I notice from receiving this medicine? Side effects that you should report to your doctor or health care professional as soon as possible: -allergic reactions like skin rash, itching or hives, swelling of the face, lips, or tongue -breathing problems -confused -fast or irregular heartbeat -fever over 102 degrees F -seizures -unusual bleeding or bruising -unusual muscle weakness Side effects that usually do not require medical attention (report to your doctor or health care professional if they continue or are bothersome): -aches and pains -diarrhea -fever of 102 degrees F or less -headache -irritable -loss of appetite -pain, tender at site where injected -trouble sleeping This list may not describe all possible side effects. Call your doctor for medical advice about side effects. You may report side effects to FDA at 1-800-FDA-1088. Where should I keep my medicine? This does not apply. This vaccine is given in a clinic, pharmacy, doctor's office, or other health care setting and  will not be stored at home. NOTE: This sheet is a summary. It may not cover all possible information. If you have questions about this  medicine, talk to your doctor, pharmacist, or health care provider.  2019 Elsevier/Gold Standard (2013-10-05 10:27:27)  Gastroesophageal Reflux Disease, Adult Gastroesophageal reflux (GER) happens when acid from the stomach flows up into the tube that connects the mouth and the stomach (esophagus). Normally, food travels down the esophagus and stays in the stomach to be digested. However, when a person has GER, food and stomach acid sometimes move back up into the esophagus. If this becomes a more serious problem, the person may be diagnosed with a disease called gastroesophageal reflux disease (GERD). GERD occurs when the reflux:  Happens often.  Causes frequent or severe symptoms.  Causes problems such as damage to the esophagus. When stomach acid comes in contact with the esophagus, the acid may cause soreness (inflammation) in the esophagus. Over time, GERD may create small holes (ulcers) in the lining of the esophagus. What are the causes? This condition is caused by a problem with the muscle between the esophagus and the stomach (lower esophageal sphincter, or LES). Normally, the LES muscle closes after food passes through the esophagus to the stomach. When the LES is weakened or abnormal, it does not close properly, and that allows food and stomach acid to go back up into the esophagus. The LES can be weakened by certain dietary substances, medicines, and medical conditions, including:  Tobacco use.  Pregnancy.  Having a hiatal hernia.  Alcohol use.  Certain foods and beverages, such as coffee, chocolate, onions, and peppermint. What increases the risk? You are more likely to develop this condition if you:  Have an increased body weight.  Have a connective tissue disorder.  Use NSAID medicines. What are the signs or symptoms? Symptoms of this condition include:  Heartburn.  Difficult or painful swallowing.  The feeling of having a lump in the throat.  Abitter taste  in the mouth.  Bad breath.  Having a large amount of saliva.  Having an upset or bloated stomach.  Belching.  Chest pain. Different conditions can cause chest pain. Make sure you see your health care provider if you experience chest pain.  Shortness of breath or wheezing.  Ongoing (chronic) cough or a night-time cough.  Wearing away of tooth enamel.  Weight loss. How is this diagnosed? Your health care provider will take a medical history and perform a physical exam. To determine if you have mild or severe GERD, your health care provider may also monitor how you respond to treatment. You may also have tests, including:  A test to examine your stomach and esophagus with a small camera (endoscopy).  A test thatmeasures the acidity level in your esophagus.  A test thatmeasures how much pressure is on your esophagus.  A barium swallow or modified barium swallow test to show the shape, size, and functioning of your esophagus. How is this treated? The goal of treatment is to help relieve your symptoms and to prevent complications. Treatment for this condition may vary depending on how severe your symptoms are. Your health care provider may recommend:  Changes to your diet.  Medicine.  Surgery. Follow these instructions at home: Eating and drinking   Follow a diet as recommended by your health care provider. This may involve avoiding foods and drinks such as: ? Coffee and tea (with or without caffeine). ? Drinks that containalcohol. ? Energy drinks and sports drinks. ?  Carbonated drinks or sodas. ? Chocolate and cocoa. ? Peppermint and mint flavorings. ? Garlic and onions. ? Horseradish. ? Spicy and acidic foods, including peppers, chili powder, curry powder, vinegar, hot sauces, and barbecue sauce. ? Citrus fruit juices and citrus fruits, such as oranges, lemons, and limes. ? Tomato-based foods, such as red sauce, chili, salsa, and pizza with red sauce. ? Fried and  fatty foods, such as donuts, french fries, potato chips, and high-fat dressings. ? High-fat meats, such as hot dogs and fatty cuts of red and white meats, such as rib eye steak, sausage, ham, and bacon. ? High-fat dairy items, such as whole milk, butter, and cream cheese.  Eat small, frequent meals instead of large meals.  Avoid drinking large amounts of liquid with your meals.  Avoid eating meals during the 2-3 hours before bedtime.  Avoid lying down right after you eat.  Do not exercise right after you eat. Lifestyle   Do not use any products that contain nicotine or tobacco, such as cigarettes, e-cigarettes, and chewing tobacco. If you need help quitting, ask your health care provider.  Try to reduce your stress by using methods such as yoga or meditation. If you need help reducing stress, ask your health care provider.  If you are overweight, reduce your weight to an amount that is healthy for you. Ask your health care provider for guidance about a safe weight loss goal. General instructions  Pay attention to any changes in your symptoms.  Take over-the-counter and prescription medicines only as told by your health care provider. Do not take aspirin, ibuprofen, or other NSAIDs unless your health care provider told you to do so.  Wear loose-fitting clothing. Do not wear anything tight around your waist that causes pressure on your abdomen.  Raise (elevate) the head of your bed about 6 inches (15 cm).  Avoid bending over if this makes your symptoms worse.  Keep all follow-up visits as told by your health care provider. This is important. Contact a health care provider if:  You have: ? New symptoms. ? Unexplained weight loss. ? Difficulty swallowing or it hurts to swallow. ? Wheezing or a persistent cough. ? A hoarse voice.  Your symptoms do not improve with treatment. Get help right away if you:  Have pain in your arms, neck, jaw, teeth, or back.  Feel sweaty, dizzy,  or light-headed.  Have chest pain or shortness of breath.  Vomit and your vomit looks like blood or coffee grounds.  Faint.  Have stool that is bloody or black.  Cannot swallow, drink, or eat. Summary  Gastroesophageal reflux happens when acid from the stomach flows up into the esophagus. GERD is a disease in which the reflux happens often, causes frequent or severe symptoms, or causes problems such as damage to the esophagus.  Treatment for this condition may vary depending on how severe your symptoms are. Your health care provider may recommend diet and lifestyle changes, medicine, or surgery.  Contact a health care provider if you have new or worsening symptoms.  Take over-the-counter and prescription medicines only as told by your health care provider. Do not take aspirin, ibuprofen, or other NSAIDs unless your health care provider told you to do so.  Keep all follow-up visits as told by your health care provider. This is important. This information is not intended to replace advice given to you by your health care provider. Make sure you discuss any questions you have with your health care provider. Document  Released: 10/08/2004 Document Revised: 07/07/2017 Document Reviewed: 07/07/2017 Elsevier Interactive Patient Education  2019 [place] for Gastroesophageal Reflux Disease, Adult When you have gastroesophageal reflux disease (GERD), the foods you eat and your eating habits are very important. Choosing the right foods can help ease the discomfort of GERD. Consider working with a diet and nutrition specialist (dietitian) to help you make healthy food choices. What general guidelines should I follow?  Eating plan  Choose healthy foods low in fat, such as fruits, vegetables, whole grains, low-fat dairy products, and lean meat, fish, and poultry.  Eat frequent, small meals instead of three large meals each day. Eat your meals slowly, in a relaxed setting. Avoid  bending over or lying down until 2-3 hours after eating.  Limit high-fat foods such as fatty meats or fried foods.  Limit your intake of oils, butter, and shortening to less than 8 teaspoons each day.  Avoid the following: ? Foods that cause symptoms. These may be different for different people. Keep a food diary to keep track of foods that cause symptoms. ? Alcohol. ? Drinking large amounts of liquid with meals. ? Eating meals during the 2-3 hours before bed.  Cook foods using methods other than frying. This may include baking, grilling, or broiling. Lifestyle  Maintain a healthy weight. Ask your health care provider what weight is healthy for you. If you need to lose weight, work with your health care provider to do so safely.  Exercise for at least 30 minutes on 5 or more days each week, or as told by your health care provider.  Avoid wearing clothes that fit tightly around your waist and chest.  Do not use any products that contain nicotine or tobacco, such as cigarettes and e-cigarettes. If you need help quitting, ask your health care provider.  Sleep with the head of your bed raised. Use a wedge under the mattress or blocks under the bed frame to raise the head of the bed. What foods are not recommended? The items listed may not be a complete list. Talk with your dietitian about what dietary choices are best for you. Grains Pastries or quick breads with added fat. [place] toast. Vegetables Deep fried vegetables. [place] fries. Any vegetables prepared with added fat. Any vegetables that cause symptoms. For some people this may include tomatoes and tomato products, chili peppers, onions and garlic, and horseradish. Fruits Any fruits prepared with added fat. Any fruits that cause symptoms. For some people this may include citrus fruits, such as oranges, grapefruit, pineapple, and lemons. Meats and other protein foods High-fat meats, such as fatty beef or pork, hot dogs, ribs, ham,  sausage, salami and bacon. Fried meat or protein, including fried fish and fried chicken. Nuts and nut butters. Dairy Whole milk and chocolate milk. Sour cream. Cream. Ice cream. Cream cheese. Milk shakes. Beverages Coffee and tea, with or without caffeine. Carbonated beverages. Sodas. Energy drinks. Fruit juice made with acidic fruits (such as orange or grapefruit). Tomato juice. Alcoholic drinks. Fats and oils Butter. Margarine. Shortening. Ghee. Sweets and desserts Chocolate and cocoa. Donuts. Seasoning and other foods Pepper. Peppermint and spearmint. Any condiments, herbs, or seasonings that cause symptoms. For some people, this may include curry, hot sauce, or vinegar-based salad dressings. Summary  When you have gastroesophageal reflux disease (GERD), food and lifestyle choices are very important to help ease the discomfort of GERD.  Eat frequent, small meals instead of three large meals each day. Eat your meals slowly,  in a relaxed setting. Avoid bending over or lying down until 2-3 hours after eating.  Limit high-fat foods such as fatty meat or fried foods. This information is not intended to replace advice given to you by your health care provider. Make sure you discuss any questions you have with your health care provider. Document Released: 12/29/2004 Document Revised: 12/31/2015 Document Reviewed: 12/31/2015 Elsevier Interactive Patient Education  2019 [employer].

## 2018-07-01 NOTE — Progress Notes (Addendum)
Telephone Note audio failed  I connected with Brandon Huffman  on 07/01/18 at 10:13 AM EDT by a telephone and verified that I am speaking with the correct person using two identifiers.  Location patient: home Location provider:work Persons participating in the virtual visit: patient, provider  I discussed the limitations of evaluation and management by telemedicine and the availability of in person appointments. The patient expressed understanding and agreed to proceed.   HPI: 1. HLD off lipitor 20 mg x 3-4 months  2. Sleep with chronic insomnia trazadone 50 mg is not helping ambien 10 mg prn in the past helped currently sleeping 6-7 hrs and getting PHd and staying up late nights and wants to go back on ambien  3. GERD/small HH-on protonix 40 mg taking qd prn due to renal thought elevated protein due to protonix  4. OSA not on CPAP due to piece broke and never repaired or called to get it fixed his wifes a dentist and made a mouth piece     ROS: See pertinent positives and negatives per HPI.  Past Medical History:  Diagnosis Date  . Adult ADHD    Dr. Rosine Door  . Allergy   . Cancer ([place]) 2012   PROSTATE  . Family hx of colon cancer 05/08/2016  . GERD (gastroesophageal reflux disease)   . History of implantation of artificial sphincter   . Hx of malignant neoplasm of prostate 05/08/2016   2012, s/p radical prostatectomy, urologist is Dr. Tresa Endo [facility]  . Hx of malignant neoplasm of prostate 05/08/2016   2012, s/p radical prostatectomy, urologist is Dr. Tresa Endo [facility]  . Insomnia   . Proteinuria 03/11/2018   Evaluated by nephrologist Feb 2020; may be interstitial, related to pantoprazole use  . Sleep apnea    CPAP  . Varicose vein of leg    removed veins in 1972    Past Surgical History:  Procedure Laterality Date  . artificaill sphincter  12/2015  . COLONOSCOPY  2016  . HERNIA REPAIR  1062   umbilical   . PENILE PROSTHESIS IMPLANT    . PROSTATECTOMY  2012   Radical Prostatectomy.  Marland Kitchen UMBILICAL HERNIA REPAIR N/A 12/11/2016   Procedure: HERNIA REPAIR UMBILICAL ADULT;  Surgeon: Christene Lye, MD;  Location: [facility] ORS;  Service: General;  Laterality: N/A;  . VARICOSE VEIN SURGERY Left 1972    Family History  Problem Relation Age of Onset  . Cancer Sister        adrenal gland cancer  . Cancer Brother        Prostate CA  . Pneumonia Mother   . Dementia Father   . Cancer Brother        lung (smoker)  . Cancer Sister        colon  . Diabetes Brother   . Irritable bowel syndrome Brother   . 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  OMEGA-3 FATTY ACIDS PO, Take by mouth., Disp: , Rfl:  .  Triamcinolone Acetonide (NASACORT ALLERGY 24HR NA), Place into the nose as needed., Disp: , Rfl:  .  Flaxseed, Linseed, (FLAX SEED OIL) 1000 MG CAPS, Take 1,000 mg by mouth daily., Disp: , Rfl:  .  GRAPE SEED EXTRACT PO, Take 1 capsule daily by mouth., Disp: , Rfl:  .  Humidifier MISC, , Disp: , Rfl:  .  Melatonin 3 MG TABS, Take 3 mg by mouth at bedtime. Reported on 03/21/2015, Disp: , Rfl:  .  Multiple Vitamins-Minerals (MULTIVITAMIN WITH MINERALS) tablet, Take 1 tablet daily by mouth. , Disp: , Rfl:  .  OVER THE COUNTER MEDICATION, Take 1 tablet by mouth daily as needed. Privigin - for memory , Disp: , Rfl:  .  pantoprazole (PROTONIX) 40 MG tablet, Take 1 tablet (40 mg total) by mouth daily. 30 minutes, Disp: 90 tablet, Rfl: 3 .  Turmeric 500 MG CAPS, Take 500 mg by mouth daily., Disp: , Rfl:  .  zolpidem (AMBIEN) 5 MG tablet, Take 1 tablet (5 mg total) by mouth at bedtime as needed for up to 30 days for sleep., Disp: 30 tablet, Rfl: 5  EXAM: while video was working  [place] per patient if applicable:  GENERAL: alert, oriented, appears well and in no acute distress  HEENT: atraumatic, conjunttiva clear, no obvious abnormalities on inspection of external  nose and ears  NECK: normal movements of the head and neck  LUNGS: on inspection no signs of respiratory distress, breathing rate appears normal, no obvious gross SOB, gasping or wheezing  CV: no obvious cyanosis  MS: moves all visible extremities without noticeable abnormality  PSYCH/NEURO: pleasant and cooperative, no obvious depression or anxiety, speech and thought processing grossly intact  ASSESSMENT AND PLAN:  Discussed the following assessment and plan:  Insomnia, unspecified type - Plan: zolpidem (AMBIEN) 5 MG tablet, d/c trazadone 50 mg qhs   Gastroesophageal reflux disease, esophagitis presence not specified with small H/H - Plan: pantoprazole (PROTONIX) 40 MG tablet qd prn  Mailed gERd list   Sleep apnea, unspecified type - off cpap using mouth piece dental Hyperlipidemia, unspecified hyperlipidemia type - Plan: Lipid panel, off lipitor 20 mg qhs will check lipid fasting   Elevated blood protein - Plan: thought 2/2 PPI using sparingly SPEP/UPEP negative   HM-due welcome to medicare physical at f/u  Flu shot utd pna 23 rec prevnar 12/04/18 shingrix 2/2 utd  rec Tdap and given Rx   H/o Prostate cancer getting PSA checked today with [name] urology  Check hep C  Colonoscopy 05/2016 [facility] GI polyps mild colitis he f/u with Dr. Alain Marion Physical at f/u filled out forms for work labs 07/01/18 see attached my chart CMET normal Na 136, Na 3.9, Cl 104, Co2 24, BUN 11, glucose 82, Cr 0.74, Ca 9.2 TP 7.9, Alb 4.3, TB 0.4, AST 26, ALT 35, AP 70  CBC 5.1/14.1/41.7/171 PSA 0.12  LDL 64 TC 160, TG 105, HLD >40 Hep C neg  TSH 1.407 normal  Vitamin D 46     I discussed the assessment and treatment plan with the patient. The patient was provided an opportunity to ask questions and all were answered. The patient agreed with the plan and demonstrated an understanding of the instructions.   The patient was advised to call back or seek an in-person evaluation if the symptoms worsen or if  the condition fails to improve as anticipated.  Time spent 25 min Delorise Jackson, MD

## 2018-07-07 ENCOUNTER — Encounter: Payer: Self-pay | Admitting: Internal Medicine

## 2018-07-08 ENCOUNTER — Telehealth: Payer: Self-pay | Admitting: Internal Medicine

## 2018-07-08 ENCOUNTER — Encounter: Payer: Self-pay | Admitting: Internal Medicine

## 2018-07-08 ENCOUNTER — Ambulatory Visit: Payer: Medicare Other | Admitting: Family Medicine

## 2018-07-08 NOTE — Telephone Encounter (Signed)
It's in color folder up front.

## 2018-07-08 NOTE — Telephone Encounter (Signed)
Patient states the labs are done and he will forward them to you.  He stated [facility] had you at a facility in [facility] and that's where they sent the results, but he is forwarding them to you today.  Jarrick Fjeld,cma

## 2018-07-08 NOTE — Telephone Encounter (Signed)
Pt came in and dropped off two papers that need signatures on it. Pt would like to be called when its ready.  Call pt @ [phone]. Thank you!

## 2018-07-08 NOTE — Telephone Encounter (Signed)
Did pt have labs at fasting labs at [place] so I can fill out incentive form  Brandon Huffman

## 2018-07-12 NOTE — Telephone Encounter (Signed)
paperwork placed in provider box.

## 2018-07-25 ENCOUNTER — Other Ambulatory Visit: Payer: Self-pay | Admitting: Family Medicine

## 2018-08-27 ENCOUNTER — Other Ambulatory Visit: Payer: Self-pay | Admitting: Radiology

## 2018-08-27 DIAGNOSIS — Z20822 Contact with and (suspected) exposure to covid-19: Secondary | ICD-10-CM

## 2018-08-28 LAB — NOVEL CORONAVIRUS, NAA: SARS-CoV-2, NAA: NOT DETECTED

## 2018-09-13 DIAGNOSIS — M2390 Unspecified internal derangement of unspecified knee: Secondary | ICD-10-CM | POA: Insufficient documentation

## 2018-11-05 ENCOUNTER — Other Ambulatory Visit: Payer: Self-pay | Admitting: Family Medicine

## 2018-11-24 ENCOUNTER — Encounter: Payer: Self-pay | Admitting: Internal Medicine

## 2018-11-24 ENCOUNTER — Other Ambulatory Visit: Payer: Self-pay | Admitting: Internal Medicine

## 2018-11-24 DIAGNOSIS — G4700 Insomnia, unspecified: Secondary | ICD-10-CM

## 2018-11-24 MED ORDER — ZOLPIDEM TARTRATE 10 MG PO TABS: 10.0000 mg | ORAL_TABLET | Freq: Every evening | ORAL | 5 refills | Status: DC | PRN

## 2019-01-03 ENCOUNTER — Encounter: Payer: Self-pay | Admitting: Internal Medicine

## 2019-01-03 ENCOUNTER — Ambulatory Visit (INDEPENDENT_AMBULATORY_CARE_PROVIDER_SITE_OTHER): Payer: Medicare (Managed Care) | Admitting: Internal Medicine

## 2019-01-03 ENCOUNTER — Telehealth: Payer: Self-pay | Admitting: Internal Medicine

## 2019-01-03 ENCOUNTER — Other Ambulatory Visit: Payer: Self-pay

## 2019-01-03 VITALS — BP 125/81 | Ht 73.0 in | Wt 191.0 lb

## 2019-01-03 DIAGNOSIS — M25562 Pain in left knee: Secondary | ICD-10-CM | POA: Diagnosis not present

## 2019-01-03 DIAGNOSIS — Z1329 Encounter for screening for other suspected endocrine disorder: Secondary | ICD-10-CM

## 2019-01-03 DIAGNOSIS — E559 Vitamin D deficiency, unspecified: Secondary | ICD-10-CM

## 2019-01-03 DIAGNOSIS — D649 Anemia, unspecified: Secondary | ICD-10-CM

## 2019-01-03 DIAGNOSIS — Z23 Encounter for immunization: Secondary | ICD-10-CM | POA: Diagnosis not present

## 2019-01-03 DIAGNOSIS — Z1389 Encounter for screening for other disorder: Secondary | ICD-10-CM

## 2019-01-03 DIAGNOSIS — F5101 Primary insomnia: Secondary | ICD-10-CM | POA: Diagnosis not present

## 2019-01-03 DIAGNOSIS — Z13220 Encounter for screening for lipoid disorders: Secondary | ICD-10-CM

## 2019-01-03 DIAGNOSIS — K219 Gastro-esophageal reflux disease without esophagitis: Secondary | ICD-10-CM

## 2019-01-03 DIAGNOSIS — Z8546 Personal history of malignant neoplasm of prostate: Secondary | ICD-10-CM

## 2019-01-03 DIAGNOSIS — Z0000 Encounter for general adult medical examination without abnormal findings: Secondary | ICD-10-CM

## 2019-01-03 MED ORDER — TETANUS-DIPHTH-ACELL PERTUSSIS 5-2.5-18.5 LF-MCG/0.5 IM SUSP: 0.5000 mL | Freq: Once | INTRAMUSCULAR | 0 refills | Status: AC

## 2019-01-03 NOTE — Progress Notes (Signed)
Left message for patient to return call back to schedule a nurse visit for prevnar

## 2019-01-03 NOTE — Telephone Encounter (Signed)
Pt states he will call back to schedule 65m f.u

## 2019-01-03 NOTE — Progress Notes (Signed)
Virtual Visit via Video Note  I connected with Brandon Huffman  on 01/03/19 at 11:20 AM EST by a video enabled telemedicine application and verified that I am speaking with the correct person using two identifiers.  Location patient:work  Location provider:work or home office Persons participating in the virtual visit: patient, provider  I discussed the limitations of evaluation and management by telemedicine and the availability of in person appointments. The patient expressed understanding and agreed to proceed.   HPI: 1. H/o prostate cancer f/u urology [facility] PSA from 0.12 to 0.14 saw 01/02/19 urology s/p radical prostatectomy and will f/u in 3-6 months per urology  2. Insomnia doing well on ambien 10 mg every night wants to remain on this dose  3. gerd on protonix every day and maalox which is helping for now, renal wanted him to use sparingly ppi due to renal proteinuria  4. C/o left knee pain and swelling hit knee on pole 1-2 months ago heard a bang and more painful x 1-2 weeks 5/10 not tried ice but tried voltaren gel and ? If helps he has problems bending his knee with exercising daily and feels like knee is stretching    ROS: See pertinent positives and negatives per HPI. General Wt stable HEENT: normal hearing  CV: no chest pain  Lungs: no sob  GI no blood in stool, +gerd at times GU: rising slightly PSA, urinating ok at times increased freq urination MSK: left knee pain +  Neuro: no h/a  Psych: +insomnia, chronic  Skin: no issues    Past Medical History:  Diagnosis Date  . Adult ADHD    Dr. Rosine Door  . Allergy   . Cancer ([place]) 2012   PROSTATE  . Family hx of colon cancer 05/08/2016  . GERD (gastroesophageal reflux disease)   . History of implantation of artificial sphincter   . Hx of malignant neoplasm of prostate 05/08/2016   2012, s/p radical prostatectomy, urologist is Dr. Tresa Endo [facility]  . Hx of malignant neoplasm of prostate 05/08/2016   2012, s/p radical  prostatectomy, urologist is Dr. Tresa Endo [facility]  . Insomnia   . Proteinuria 03/11/2018   Evaluated by nephrologist Feb 2020; may be interstitial, related to pantoprazole use  . Sleep apnea    CPAP  . Varicose vein of leg    removed veins in 1972    Past Surgical History:  Procedure Laterality Date  . artificaill sphincter  12/2015  . COLONOSCOPY  2016  . HERNIA REPAIR  [id number]   umbilical   . PENILE PROSTHESIS IMPLANT    . PROSTATECTOMY  2012   Radical Prostatectomy.  Marland Kitchen UMBILICAL HERNIA REPAIR N/A 12/11/2016   Procedure: HERNIA REPAIR UMBILICAL ADULT;  Surgeon: Christene Lye, MD;  Location: [facility] ORS;  Service: General;  Laterality: N/A;  . VARICOSE VEIN SURGERY Left 1972    Family History  Problem Relation Age of Onset  . Cancer Sister        adrenal gland cancer  . Cancer Brother        Prostate CA  . Pneumonia Mother   . Dementia Father   . Cancer Brother        lung (smoker)  . Cancer Sister        colon  . Diabetes Brother   . Irritable bowel syndrome Brother   . 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  Humidifier MISC, , Disp: , Rfl:  .  Melatonin 3 MG TABS, Take 3 mg by mouth at bedtime. Reported on 03/21/2015, Disp: , Rfl:  .  Multiple Vitamins-Minerals (MULTIVITAMIN WITH MINERALS) tablet, Take 1 tablet daily by mouth. , Disp: , Rfl:  .  OMEGA-3 FATTY ACIDS PO, Take by mouth., Disp: , Rfl:  .  OVER THE COUNTER MEDICATION, Take 1 tablet by mouth daily as needed. Privigin - for memory , Disp: , Rfl:  .  pantoprazole (PROTONIX) 40 MG tablet, Take 1 tablet (40 mg total) by mouth daily. 30 minutes, Disp: 90 tablet, Rfl: 3 .  Triamcinolone Acetonide (NASACORT ALLERGY 24HR NA), Place into the nose as needed., Disp: , Rfl:  .  Turmeric 500  MG CAPS, Take 500 mg by mouth daily., Disp: , Rfl:  .  Tdap (BOOSTRIX) 5-2.5-18.5 LF-MCG/0.5 injection, Inject 0.5 mLs into the muscle once for 1 dose., Disp: 0.5 mL, Rfl: 0 .  zolpidem (AMBIEN) 10 MG tablet, Take 1 tablet (10 mg total) by mouth at bedtime as needed for sleep., Disp: 30 tablet, Rfl: 5  EXAM:  VITALS per patient if applicable:  GENERAL: alert, oriented, appears well and in no acute distress  HEENT: atraumatic, conjunttiva clear, no obvious abnormalities on inspection of external nose and ears  NECK: normal movements of the head and neck  LUNGS: on inspection no signs of respiratory distress, breathing rate appears normal, no obvious gross SOB, gasping or wheezing  CV: no obvious cyanosis  MS: moves all visible extremities without noticeable abnormality  PSYCH/NEURO: pleasant and cooperative, no obvious depression or anxiety, speech and thought processing grossly intact  ASSESSMENT AND PLAN:  Discussed the following assessment and plan:  Acute pain of left knee - Plan: DG Knee Complete 4 Views Left Consider MRI knee left in future and ortho if Xray neg pt wants to wait until 01/2019 for Xray  Primary insomnia -cont ambien 10 mg at bedtime   Gastroesophageal reflux disease, unspecified whether esophagitis present Limit PPI use to 40 mg protonix every day due to proteinuria per renal  Will see when Dr. Alain Marion wants to f/u with pt h/o IBS/colitis noted colonoscopy in 2018  EGD had 11/2017   HM Flu shot utd had in 10/2018  pna 23 rec prevnar 12/04/18 disc with pt today will come in office shingrix 2/2 utd  rec Tdap sent to pharmacy   H/o Prostate cancer s/p radical prostectomy getting PSA [facility] urology saw 12/2018 due to f/u in 3-6 months PSA 0.12 and 0.14 as of 01/02/19 hep C 07/01/18 Colonoscopy 05/2016 [facility] Dr. Alain Marion polyps mild colitis he f/u with Dr. Alain Marion EGD had 11/2017 Dr. Alain Marion   Labs 07/01/2018 see care everywhere  CMET normal Na 136, Na 3.9, Cl 104, Co2  24, BUN 11, glucose 82, Cr 0.74, Ca 9.2 TP 7.9, Alb 4.3, TB 0.4, AST 26, ALT 35, AP 70  CBC 5.1/14.1/41.7/171 PSA 0.12  LDL 64 TC 160, TG 105, HLD >40 TSH 1.407 normal  Vitamin D 46  UA had 01/24/2018 normal   -we discussed possible serious and likely etiologies, options for evaluation and workup, limitations of telemedicine visit vs in person visit, treatment, treatment risks and precautions. Pt prefers to treat via telemedicine empirically rather then risking or undertaking an  in person visit at this moment. Patient agrees to seek prompt in person care if worsening, new symptoms arise, or if is not improving with treatment.   I discussed the assessment and treatment plan with the patient. The patient was provided an opportunity to ask questions and all were answered. The patient agreed with the plan and demonstrated an understanding of the instructions.   The patient was advised to call back or seek an in-person evaluation if the symptoms worsen or if the condition fails to improve as anticipated.  Time spent 25 minutes  Delorise Jackson, MD

## 2019-01-03 NOTE — Patient Instructions (Signed)
Knee Exercises Ask your health care provider which exercises are safe for you. Do exercises exactly as told by your health care provider and adjust them as directed. It is normal to feel mild stretching, pulling, tightness, or discomfort as you do these exercises. Stop right away if you feel sudden pain or your pain gets worse. Do not begin these exercises until told by your health care provider. Stretching and range-of-motion exercises These exercises warm up your muscles and joints and improve the movement and flexibility of your knee. These exercises also help to relieve pain and swelling. Knee extension, prone 1. Lie on your abdomen (prone position) on a bed. 2. Place your left / right knee just beyond the edge of the surface so your knee is not on the bed. You can put a towel under your left / right thigh just above your kneecap for comfort. 3. Relax your leg muscles and allow gravity to straighten your knee (extension). You should feel a stretch behind your left / right knee. 4. Hold this position for __________ seconds. 5. Scoot up so your knee is supported between repetitions. Repeat __________ times. Complete this exercise __________ times a day. Knee flexion, active  1. Lie on your back with both legs straight. If this causes back discomfort, bend your left / right knee so your foot is flat on the floor. 2. Slowly slide your left / right heel back toward your buttocks. Stop when you feel a gentle stretch in the front of your knee or thigh (flexion). 3. Hold this position for __________ seconds. 4. Slowly slide your left / right heel back to the starting position. Repeat __________ times. Complete this exercise __________ times a day. Quadriceps stretch, prone  1. Lie on your abdomen on a firm surface, such as a bed or padded floor. 2. Bend your left / right knee and hold your ankle. If you cannot reach your ankle or pant leg, loop a belt around your foot and grab the belt  instead. 3. Gently pull your heel toward your buttocks. Your knee should not slide out to the side. You should feel a stretch in the front of your thigh and knee (quadriceps). 4. Hold this position for __________ seconds. Repeat __________ times. Complete this exercise __________ times a day. Hamstring, supine 1. Lie on your back (supine position). 2. Loop a belt or towel over the ball of your left / right foot. The ball of your foot is on the walking surface, right under your toes. 3. Straighten your left / right knee and slowly pull on the belt to raise your leg until you feel a gentle stretch behind your knee (hamstring). ? Do not let your knee bend while you do this. ? Keep your other leg flat on the floor. 4. Hold this position for __________ seconds. Repeat __________ times. Complete this exercise __________ times a day. Strengthening exercises These exercises build strength and endurance in your knee. Endurance is the ability to use your muscles for a long time, even after they get tired. Quadriceps, isometric This exercise stretches the muscles in front of your thigh (quadriceps) without moving your knee joint (isometric). 1. Lie on your back with your left / right leg extended and your other knee bent. Put a rolled towel or small pillow under your knee if told by your health care provider. 2. Slowly tense the muscles in the front of your left / right thigh. You should see your kneecap slide up toward your hip or  see increased dimpling just above the knee. This motion will push the back of the knee toward the floor. 3. For __________ seconds, hold the muscle as tight as you can without increasing your pain. 4. Relax the muscles slowly and completely. Repeat __________ times. Complete this exercise __________ times a day. Straight leg raises This exercise stretches the muscles in front of your thigh (quadriceps) and the muscles that move your hips (hip flexors). 1. Lie on your back with  your left / right leg extended and your other knee bent. 2. Tense the muscles in the front of your left / right thigh. You should see your kneecap slide up or see increased dimpling just above the knee. Your thigh may even shake a bit. 3. Keep these muscles tight as you raise your leg 4-6 inches (10-15 cm) off the floor. Do not let your knee bend. 4. Hold this position for __________ seconds. 5. Keep these muscles tense as you lower your leg. 6. Relax your muscles slowly and completely after each repetition. Repeat __________ times. Complete this exercise __________ times a day. Hamstring, isometric 1. Lie on your back on a firm surface. 2. Bend your left / right knee about __________ degrees. 3. Dig your left / right heel into the surface as if you are trying to pull it toward your buttocks. Tighten the muscles in the back of your thighs (hamstring) to "dig" as hard as you can without increasing any pain. 4. Hold this position for __________ seconds. 5. Release the tension gradually and allow your muscles to relax completely for __________ seconds after each repetition. Repeat __________ times. Complete this exercise __________ times a day. Hamstring curls If told by your health care provider, do this exercise while wearing ankle weights. Begin with __________ lb weights. Then increase the weight by 1 lb (0.5 kg) increments. Do not wear ankle weights that are more than __________ lb. 1. Lie on your abdomen with your legs straight. 2. Bend your left / right knee as far as you can without feeling pain. Keep your hips flat against the floor. 3. Hold this position for __________ seconds. 4. Slowly lower your leg to the starting position. Repeat __________ times. Complete this exercise __________ times a day. Squats This exercise strengthens the muscles in front of your thigh and knee (quadriceps). 1. Stand in front of a table, with your feet and knees pointing straight ahead. You may rest your  hands on the table for balance but not for support. 2. Slowly bend your knees and lower your hips like you are going to sit in a chair. ? Keep your weight over your heels, not over your toes. ? Keep your lower legs upright so they are parallel with the table legs. ? Do not let your hips go lower than your knees. ? Do not bend lower than told by your health care provider. ? If your knee pain increases, do not bend as low. 3. Hold the squat position for __________ seconds. 4. Slowly push with your legs to return to standing. Do not use your hands to pull yourself to standing. Repeat __________ times. Complete this exercise __________ times a day. Wall slides This exercise strengthens the muscles in front of your thigh and knee (quadriceps). 1. Lean your back against a smooth wall or door, and walk your feet out 18-24 inches (46-61 cm) from it. 2. Place your feet hip-width apart. 3. Slowly slide down the wall or door until your knees bend __________ degrees.   Keep your knees over your heels, not over your toes. Keep your knees in line with your hips. 4. Hold this position for __________ seconds. Repeat __________ times. Complete this exercise __________ times a day. Straight leg raises This exercise strengthens the muscles that rotate the leg at the hip and move it away from your body (hip abductors). 1. Lie on your side with your left / right leg in the top position. Lie so your head, shoulder, knee, and hip line up. You may bend your bottom knee to help you keep your balance. 2. Roll your hips slightly forward so your hips are stacked directly over each other and your left / right knee is facing forward. 3. Leading with your heel, lift your top leg 4-6 inches (10-15 cm). You should feel the muscles in your outer hip lifting. ? Do not let your foot drift forward. ? Do not let your knee roll toward the ceiling. 4. Hold this position for __________ seconds. 5. Slowly return your leg to the  starting position. 6. Let your muscles relax completely after each repetition. Repeat __________ times. Complete this exercise __________ times a day. Straight leg raises This exercise stretches the muscles that move your hips away from the front of the pelvis (hip extensors). 1. Lie on your abdomen on a firm surface. You can put a pillow under your hips if that is more comfortable. 2. Tense the muscles in your buttocks and lift your left / right leg about 4-6 inches (10-15 cm). Keep your knee straight as you lift your leg. 3. Hold this position for __________ seconds. 4. Slowly lower your leg to the starting position. 5. Let your leg relax completely after each repetition. Repeat __________ times. Complete this exercise __________ times a day. This information is not intended to replace advice given to you by your health care provider. Make sure you discuss any questions you have with your health care provider. Document Released: 11/12/2004 Document Revised: 10/19/2017 Document Reviewed: 10/19/2017 Elsevier Patient Education  2020 [employer].

## 2019-01-03 NOTE — Progress Notes (Signed)
Note has been faxed electronically 

## 2019-01-16 ENCOUNTER — Telehealth: Payer: Self-pay | Admitting: Internal Medicine

## 2019-01-16 NOTE — Telephone Encounter (Signed)
I called pt, Pt states he will call bak to schedule Just need to schedule for the flu shot but wanted to inform that I had the Moderna first vaccine injection today and supposed to have a follow-up booster on 02/09/2019.

## 2019-03-28 ENCOUNTER — Encounter: Payer: Self-pay | Admitting: Internal Medicine

## 2019-03-28 NOTE — Progress Notes (Signed)
Received notes from Dr Candiss Norse and placed on dr tracy desk

## 2019-04-07 ENCOUNTER — Encounter: Payer: Self-pay | Admitting: Internal Medicine

## 2019-04-10 ENCOUNTER — Telehealth: Payer: Self-pay | Admitting: Internal Medicine

## 2019-04-10 NOTE — Telephone Encounter (Signed)
Left message to return call 

## 2019-04-10 NOTE — Telephone Encounter (Signed)
Patient informed and verbalized understanding.  Will place orders upfront for pick up.

## 2019-04-10 NOTE — Telephone Encounter (Signed)
Just now seeing this message due to high call volumes and messages  Ive ordered labs he can come pick up [facility] form and have fasting labs when able at [facility]  -orders I believe on printer if not print for pt please    Appears he had blood draw 04/07/19 and PSA reviewed 0.15   thanks Smyer

## 2019-04-10 NOTE — Addendum Note (Signed)
Addended by: Orland Mustard on: 04/10/2019 08:51 AM   Modules accepted: Orders

## 2019-04-10 NOTE — Telephone Encounter (Signed)
   04/07/19 4:57 PM You routed this conversation to McLean-Scocuzza, Nino Glow, MD  Emilio Aspen to McLean-Scocuzza, Nino Glow, MD     04/07/19 7:35 AM Dr. Terese Door, I am supposed to have a blood draw at [facility] as Dr. Rosana Hoes wanted to make sure my PSA was not continuing to elevate. Is it possible to write an order to have other labs done at the same time so that I do not have to go back for additional blood draws?

## 2019-04-14 ENCOUNTER — Encounter: Payer: Self-pay | Admitting: Internal Medicine

## 2019-04-22 LAB — CBC WITH DIFFERENTIAL/PLATELET
Basophils Absolute: 0 10*3/uL (ref 0.0–0.2)
Basos: 1 %
EOS (ABSOLUTE): 0.1 10*3/uL (ref 0.0–0.4)
Eos: 2 %
Hematocrit: 42.8 % (ref 37.5–51.0)
Hemoglobin: 14.8 g/dL (ref 13.0–17.7)
Immature Grans (Abs): 0 10*3/uL (ref 0.0–0.1)
Immature Granulocytes: 0 %
Lymphocytes Absolute: 1.7 10*3/uL (ref 0.7–3.1)
Lymphs: 32 %
MCH: 29.5 pg (ref 26.6–33.0)
MCHC: 34.6 g/dL (ref 31.5–35.7)
MCV: 85 fL (ref 79–97)
Monocytes Absolute: 0.5 10*3/uL (ref 0.1–0.9)
Monocytes: 8 %
Neutrophils Absolute: 3.1 10*3/uL (ref 1.4–7.0)
Neutrophils: 57 %
Platelets: 171 10*3/uL (ref 150–450)
RBC: 5.02 x10E6/uL (ref 4.14–5.80)
RDW: 14.6 % (ref 11.6–15.4)
WBC: 5.5 10*3/uL (ref 3.4–10.8)

## 2019-04-22 LAB — IRON,TIBC AND FERRITIN PANEL
Ferritin: 69 ng/mL (ref 30–400)
Iron Saturation: 29 % (ref 15–55)
Iron: 102 ug/dL (ref 38–169)
Total Iron Binding Capacity: 349 ug/dL (ref 250–450)
UIBC: 247 ug/dL (ref 111–343)

## 2019-04-22 LAB — TSH: TSH: 2.89 u[IU]/mL (ref 0.450–4.500)

## 2019-04-22 LAB — LIPID PANEL
Chol/HDL Ratio: 3.8 ratio (ref 0.0–5.0)
Cholesterol, Total: 165 mg/dL (ref 100–199)
HDL: 44 mg/dL (ref >39)
LDL Chol Calc (NIH): 100 mg/dL — ABNORMAL HIGH (ref 0–99)
Triglycerides: 119 mg/dL (ref 0–149)
VLDL Cholesterol Cal: 21 mg/dL (ref 5–40)

## 2019-04-22 LAB — URINALYSIS, ROUTINE W REFLEX MICROSCOPIC
Bilirubin, UA: NEGATIVE
Glucose, UA: NEGATIVE
Ketones, UA: NEGATIVE
Leukocytes,UA: NEGATIVE
Nitrite, UA: NEGATIVE
Protein,UA: NEGATIVE
RBC, UA: NEGATIVE
Specific Gravity, UA: 1.018 (ref 1.005–1.030)
Urobilinogen, Ur: 0.2 mg/dL (ref 0.2–1.0)
pH, UA: 6.5 (ref 5.0–7.5)

## 2019-04-22 LAB — COMPREHENSIVE METABOLIC PANEL
ALT: 43 IU/L (ref 0–44)
AST: 34 IU/L (ref 0–40)
Albumin/Globulin Ratio: 1.2 (ref 1.2–2.2)
Albumin: 4.4 g/dL (ref 3.8–4.8)
Alkaline Phosphatase: 77 IU/L (ref 39–117)
BUN/Creatinine Ratio: 11 (ref 10–24)
BUN: 10 mg/dL (ref 8–27)
Bilirubin Total: 0.4 mg/dL (ref 0.0–1.2)
CO2: 23 mmol/L (ref 20–29)
Calcium: 9.6 mg/dL (ref 8.6–10.2)
Chloride: 104 mmol/L (ref 96–106)
Creatinine, Ser: 0.87 mg/dL (ref 0.76–1.27)
GFR calc Af Amer: 103 mL/min/{1.73_m2} (ref >59)
GFR calc non Af Amer: 89 mL/min/{1.73_m2} (ref >59)
Globulin, Total: 3.6 g/dL (ref 1.5–4.5)
Glucose: 87 mg/dL (ref 65–99)
Potassium: 5.2 mmol/L (ref 3.5–5.2)
Sodium: 139 mmol/L (ref 134–144)
Total Protein: 8 g/dL (ref 6.0–8.5)

## 2019-04-22 LAB — VITAMIN D 25 HYDROXY (VIT D DEFICIENCY, FRACTURES): Vit D, 25-Hydroxy: 43.4 ng/mL (ref 30.0–100.0)

## 2019-04-23 ENCOUNTER — Encounter: Payer: Self-pay | Admitting: Internal Medicine

## 2019-04-27 ENCOUNTER — Telehealth: Payer: Self-pay | Admitting: Internal Medicine

## 2019-04-27 NOTE — Telephone Encounter (Signed)
Added to 4/15 phone encounter.

## 2019-04-27 NOTE — Telephone Encounter (Signed)
Form ready and in box

## 2019-04-27 NOTE — Telephone Encounter (Signed)
I scheduled pt for 07/14/19 for physical-He also dropped off something that needs PCP signature and would like to be called when it is ready to be picked up-placed in colored folder up front

## 2019-04-27 NOTE — Telephone Encounter (Signed)
Renaldo Fiddler L routed conversation to You 1 hour ago (9:17 AM)  Renaldo Fiddler L 1 hour ago (9:17 AM)  MH   I scheduled pt for 07/14/19 for physical-He also dropped off something that needs PCP signature and would like to be called when it is ready to be picked up-placed in colored folder up front      Documentation   Emilio Aspen  McLean-Scocuzza, Nino Glow, MD 1 hour ago (8:42 AM)   Do you have anything available early morning on July 30 or were you referring to the week of June 29?   You  Kernie, Gerring B 3 days ago   Good morning,  Your yearly appointment will need to be on or after 07/01/19 as this is when you had it in 2020. This way we will be sure your insurance will cover this visit.   We have appointments available the week of 07/29, that Tuesday, and on. What days of the week and times work best for you?    Emilio Aspen  McLean-Scocuzza, Nino Glow, MD 4 days ago   I need to have a yearly well  check! I have had my two Moderna vaccines with the last one in January! I have not had a face to face visit yet!

## 2019-04-28 NOTE — Telephone Encounter (Signed)
Patient informed and verbalized understanding.  Form up front

## 2019-05-31 ENCOUNTER — Encounter: Payer: Self-pay | Admitting: Internal Medicine

## 2019-06-02 ENCOUNTER — Ambulatory Visit (INDEPENDENT_AMBULATORY_CARE_PROVIDER_SITE_OTHER): Payer: Medicare (Managed Care)

## 2019-06-02 VITALS — BP 165/97 | HR 64 | Ht 75.0 in | Wt 191.0 lb

## 2019-06-02 DIAGNOSIS — Z0000 Encounter for general adult medical examination without abnormal findings: Secondary | ICD-10-CM

## 2019-06-02 DIAGNOSIS — Z1211 Encounter for screening for malignant neoplasm of colon: Secondary | ICD-10-CM

## 2019-06-02 NOTE — Progress Notes (Addendum)
Subjective:   Brandon Huffman is a 69 y.o. male who presents for an Initial Medicare Annual Wellness Visit.  Review of Systems  No ROS.  Medicare Wellness Virtual Visit.  Visual/audio telehealth visit.  Patient provided vital signs. Encouraged to monitor, notify physician if persistent high reading. See social history for additional risk factors.  Cardiac Risk Factors include: advanced age (>67men, >5 women);male gender    Objective:    Today's Vitals   06/02/19 0827  BP: (!) 165/97  Pulse: 64  Weight: 191 lb (86.6 kg)  Height: 6\' 3"  (1.905 m)   Body mass index is 23.87 kg/m.  Advanced Directives 06/02/2019 12/11/2016 12/02/2016 10/29/2016 05/08/2016 03/21/2015 08/23/2014  Does Patient Have a Medical Advance Directive? Yes Yes Yes Yes Yes Yes Yes  Type of Paramedic of [place];Living will [place];Living will [place];Living will [place];Living will  Does patient want to make changes to medical advance directive? No - Patient declined No - Patient declined - - - - -  Copy of [place] in Chart? No - copy requested No - copy requested No - copy requested - - - -    Current Medications (verified) Outpatient Encounter Medications as of 06/02/2019  Medication Sig  . Humidifier MISC   . Melatonin 3 MG TABS Take 3 mg by mouth at bedtime. Reported on 03/21/2015  . Multiple Vitamins-Minerals (MULTIVITAMIN WITH MINERALS) tablet Take 1 tablet daily by mouth.   . OMEGA-3 FATTY ACIDS PO Take by mouth.  Marland Kitchen OVER THE COUNTER MEDICATION Take 1 tablet by mouth daily as needed. Privigin - for memory   . pantoprazole (PROTONIX) 40 MG tablet Take 1 tablet (40 mg total) by mouth daily. 30 minutes  . Triamcinolone Acetonide (NASACORT ALLERGY 24HR NA) Place into the nose as needed.  . Turmeric 500 MG CAPS Take 500 mg by mouth daily.  Marland Kitchen zolpidem (AMBIEN) 10 MG tablet  Take 1 tablet (10 mg total) by mouth at bedtime as needed for sleep.   No facility-administered encounter medications on file as of 06/02/2019.    Allergies (verified) Chocolate flavor and Tetracyclines & related   History: Past Medical History:  Diagnosis Date  . Adult ADHD    Dr. Rosine Door  . Allergy   . Cancer ([place]) 2012   PROSTATE  . Family hx of colon cancer 05/08/2016  . GERD (gastroesophageal reflux disease)   . History of implantation of artificial sphincter   . Hx of malignant neoplasm of prostate 05/08/2016   2012, s/p radical prostatectomy, urologist is Dr. Tresa Endo [facility]  . Hx of malignant neoplasm of prostate 05/08/2016   2012, s/p radical prostatectomy, urologist is Dr. Tresa Endo [facility]  . Insomnia   . Proteinuria 03/11/2018   Evaluated by nephrologist Feb 2020; may be interstitial, related to pantoprazole use  . Sleep apnea    CPAP  . Varicose vein of leg    removed veins in 1972   Past Surgical History:  Procedure Laterality Date  . artificaill sphincter  12/2015  . COLONOSCOPY  2016  . HERNIA REPAIR  [id number]   umbilical   . PENILE PROSTHESIS IMPLANT    . PROSTATECTOMY  2012   Radical Prostatectomy.  Marland Kitchen UMBILICAL HERNIA REPAIR N/A 12/11/2016   Procedure: HERNIA REPAIR UMBILICAL ADULT;  Surgeon: Christene Lye, MD;  Location: [facility] ORS;  Service: General;  Laterality: N/A;  . VARICOSE VEIN  SURGERY Left 1972   Family History  Problem Relation Age of Onset  . Cancer Sister        adrenal gland cancer  . Cancer Brother        Prostate CA  . Pneumonia Mother   . Dementia Father   . Cancer Brother        lung (smoker)  . Cancer Sister        colon  . Diabetes Brother   . Irritable bowel syndrome Brother   . Cancer Brother        prostate - in remission   Social History   Socioeconomic History  . Marital status: Married    Spouse name: Not on file  . Number of children: Not on file  . Years of education: Not on file  . Highest education  level: Not on file  Occupational History  . Not on file  Tobacco Use  . Smoking status: Never Smoker  . Smokeless tobacco: Never Used  Substance and Sexual Activity  . Alcohol use: Yes    Alcohol/week: 2.0 standard drinks    Types: 2 Glasses of wine per week    Comment: WINE OCC  . Drug use: No  . Sexual activity: Yes    Partners: Female  Other Topics Concern  . 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 Difficulty of Paying Living Expenses: Not hard at all  Food Insecurity: No Food Insecurity  . Worried About Charity fundraiser in the Last Year: Never true  . Ran Out of Food in the Last Year: Never true  Transportation Needs: No Transportation Needs  . Lack of Transportation (Medical): No  . Lack of Transportation (Non-Medical): No  Physical Activity:   . Days of Exercise per Week:   . Minutes of Exercise per Session:   Stress: No Stress Concern Present  . Feeling of Stress : Not at all  Social Connections: Unknown  . Frequency of Communication with Friends and Family: Not on file  . Frequency of Social Gatherings with Friends and Family: Not on file  . Attends Religious Services: Not on file  . Active Member of Clubs or Organizations: Not on file  . Attends Archivist Meetings: Not on file  . Marital Status: Married   Tobacco Counseling Counseling given: Not Answered   Clinical Intake:  Pre-visit preparation completed: Yes           How often do you need to have someone help you when you read instructions, pamphlets, or other written materials from your doctor or pharmacy?: 1 - Never  Interpreter Needed?: No     Activities of Daily Living In your present state of health, do you have any difficulty performing the following  activities: 06/02/2019  Hearing? N  Vision? N  Difficulty concentrating or making decisions? N  Walking or climbing stairs? N  Dressing or bathing? N  Doing errands, shopping? N  Preparing Food and eating ? N  Using the Toilet? N  In the past six months, have you accidently leaked urine? N  Do you have problems with loss of bowel control? N  Managing your Medications? N  Managing  your Finances? N  Housekeeping or managing your Housekeeping? N  Some recent data might be hidden     Immunizations and Health Maintenance Immunization History  Administered Date(s) Administered  . Influenza,inj,Quad PF,6+ Mos 10/13/2018  . Influenza-Unspecified 09/21/2017  . Moderna SARS-COVID-2 Vaccination 01/12/2019, 02/10/2019  . Pneumococcal Polysaccharide-23 12/03/2017  . Zoster 08/23/2014  . Zoster Recombinat (Shingrix) 07/11/2017, 09/25/2017   Health Maintenance Due  Topic Date Due  . COLONOSCOPY  05/28/2019    Patient Care Team: McLean-Scocuzza, Nino Glow, MD as PCP - General (Internal Medicine) Christene Lye, MD (General Surgery) Arnetha Courser, MD as Attending Physician (Family Medicine)  Indicate any recent Medical Services you may have received from other than [facility] providers in the past year (date may be approximate).    Assessment:   This is a routine wellness examination for Chayce.  I connected with Peggye Ley today by telephone and verified that I am speaking with the correct person using two identifiers. Location patient: home Location provider: work Persons participating in the virtual visit: patient, provider.   I discussed the limitations, risks, security and privacy concerns of performing an evaluation and management service by telephone and the availability of in person appointments. I also discussed with the patient that there may be a patient responsible charge related to this service. The patient expressed understanding and verbally consented to this telephonic  visit.    Interactive audio and video telecommunications were attempted between this provider and patient, however failed, due to patient having technical difficulties OR patient did not have access to video capability.  We continued and completed visit with audio only.  Some vital signs may be absent or patient reported.   Time Spent with patient on telephone encounter: 30 minutes    Hearing/Vision screen  Hearing Screening   125Hz  250Hz  500Hz  1000Hz  2000Hz  3000Hz  4000Hz  6000Hz  8000Hz   Right ear:           Left ear:           Comments: Patient is able to hear conversational tones without difficulty.  No issues reported.    Vision Screening Comments: Wears corrective lenses Visual acuity not assessed, virtual visit.  They have seen their ophthalmologist in the last 12 months.    Dietary issues and exercise activities discussed: Current Exercise Habits: Home exercise routine, Type of exercise: stretching;strength training/weights;walking, Time (Minutes): 15, Frequency (Times/Week): 7, Weekly Exercise (Minutes/Week): 105, Intensity: Mild  Goals Addressed            This Visit's Progress     Patient Stated   . I'd like to start running again (pt-stated)       5,000 steps daily Monitor sweet intake      Depression Screen PHQ 2/9 Scores 06/02/2019 01/03/2019 01/03/2018 06/18/2017  PHQ - 2 Score 0 0 0 0  PHQ- 9 Score - - 0 -    Fall Risk Fall Risk  06/02/2019 01/03/2019 01/03/2018 06/18/2017 10/29/2016  Falls in the past year? 0 0 0 No No  Number falls in past yr: 0 - 0 - -  Injury with Fall? - - 0 - -  Follow up Falls evaluation completed - - - -   TIMED UP AND GO: Was the test performed? No, virtual visit  Cognitive Function: MMSE - Mini Mental State Exam 06/02/2019  Not completed: Unable to complete     6CIT Screen 06/18/2017  What Year? 0 points  What month? 0 points  What time? 0 points  Count  back from 20 0 points  Months in reverse 0 points  Repeat phrase 2  points  Total Score 2    Screening Tests Health Maintenance  Topic Date Due  . COLONOSCOPY  05/28/2019  . PNA vac Low Risk Adult (2 of 2 - PCV13) 07/21/2019 (Originally 12/04/2018)  . INFLUENZA VACCINE  08/13/2019  . TETANUS/TDAP  08/03/2020  . COVID-19 Vaccine  Completed  . Hepatitis C Screening  Completed    Pneumococcal Vaccine: Due for Prevnar 13 vaccine. Does the patient want to receive this vaccine today?  No. Education has been provided regarding the importance of this vaccine. Advised may receive this vaccine at office, local pharmacy or Health Dept. Aware to provide a copy of the vaccination record if obtained from local pharmacy or Health Dept. Verbalized acceptance and understanding. Plans to receive at next scheduled office visit.  Covid-19 Vaccine: Completed vaccines  Cancer Screenings:  Colorectal Screening: Repeat every 3 years.  Referral to [facility] placed. Pt aware the office will call re: appt.  Lung Cancer Screening: does not qualify.   Additional Screening:  Hepatitis C Screening: Completed 07/01/18  Vision Screening: Recommended annual ophthalmology exams for early detection of glaucoma and other disorders of the eye. Is the patient up to date with their annual eye exam?  Yes  Dental Screening: Recommended annual dental exams for proper oral hygiene  Community Resource Referral:  CRR required this visit?  no   Recent labs completed 04/21/19     Plan:  Keep all routine maintenance appointments.   Cpe 07/21/19 @ 9:00  I have personally reviewed and addressed the Medicare Annual Wellness questionnaire and have noted the following in the patient's chart:  A. Medical and social history B. Use of alcohol, tobacco or illicit drugs  C. Current medications and supplements D. Functional ability and status E.  Nutritional status F.  Physical activity G. Advance directives H. List of other physicians I.  Hospitalizations, surgeries, and ER visits in previous 12  months J.  [place] such as hearing and vision if needed, cognitive and depression L. Referrals and appointments   End of life planning; Advance aging; Advanced directives discussed.  Copy of current HCPOA/Living Will requested.  Mailed additional information per patient request.   I have reviewed and discussed with patient certain preventive protocols, quality metrics, and best practice recommendations.   Carie Caddy, LPN   [id number]  Nurse Health Advisor   Nurse Notes:    Agree  [facility]

## 2019-06-02 NOTE — Patient Instructions (Addendum)
Brandon Huffman , Thank you for taking time to come for your Medicare Wellness Visit. I appreciate your ongoing commitment to your health goals. Please review the following plan we discussed and let me know if I can assist you in the future.   These are the goals we discussed: Goals      Patient Stated   . I'd like to start running again (pt-stated)     5,000 steps daily Monitor sweet intake       This is a list of the screening recommended for you and due dates:  Health Maintenance  Topic Date Due  . Colon Cancer Screening  05/28/2019  . Pneumonia vaccines (2 of 2 - PCV13) 07/21/2019*  . Flu Shot  08/13/2019  . Tetanus Vaccine  08/03/2020  . COVID-19 Vaccine  Completed  .  Hepatitis C: One time screening is recommended by Center for Disease Control  (CDC) for  adults born from [birth year] through 1963.   Completed  *Topic was postponed. The date shown is not the original due date.    Colonoscopy, Adult A colonoscopy is a procedure to look at the entire large intestine. This procedure is done using a long, thin, flexible tube that has a camera on the end. You may have a colonoscopy:  As a part of normal colorectal screening.  If you have certain symptoms, such as: ? A low number of red blood cells in your blood (anemia). ? Diarrhea that does not go away. ? Pain in your abdomen. ? Blood in your stool. A colonoscopy can help screen for and diagnose medical problems, including:  Tumors.  Extra tissue that grows where mucus forms (polyps).  Inflammation.  Areas of bleeding. Tell your health care provider about:  Any allergies you have.  All medicines you are taking, including vitamins, herbs, eye drops, creams, and over-the-counter medicines.  Any problems you or family members have had with anesthetic medicines.  Any blood disorders you have.  Any surgeries you have had.  Any medical conditions you have.  Any problems you have had with having bowel movements.  Whether  you are pregnant or may be pregnant. What are the risks? Generally, this is a safe procedure. However, problems may occur, including:  Bleeding.  Damage to your intestine.  Allergic reactions to medicines given during the procedure.  Infection. This is rare. What happens before the procedure? Eating and drinking restrictions Follow instructions from your health care provider about eating or drinking restrictions, which may include:  A few days before the procedure: ? Follow a low-fiber diet. ? Avoid nuts, seeds, dried fruit, raw fruits, and vegetables.  1-3 days before the procedure: ? Eat only gelatin dessert or ice pops. ? Drink only clear liquids, such as water, clear juice, clear broth or bouillon, black coffee or tea, or clear soft drinks or sports drinks. ? Avoid liquids that contain red or purple dye.  The day of the procedure: ? Do not eat solid foods. You may continue to drink clear liquids until up to 2 hours before the procedure. ? Do not eat or drink anything starting 2 hours before the procedure, or within the time period that your health care provider recommends. Bowel prep If you were prescribed a bowel prep to take by mouth (orally) to clean out your colon:  Take it as told by your health care provider. Starting the day before your procedure, you will need to drink a large amount of liquid medicine. The liquid will cause  you to have many bowel movements of loose stool until your stool becomes almost clear or light green.  If your skin or the opening between the buttocks (anus) gets irritated from diarrhea, you may relieve the irritation using: ? Wipes with medicine in them, such as adult wet wipes with aloe and vitamin E. ? A product to soothe skin, such as petroleum jelly.  If you vomit while drinking the bowel prep: ? Take a break for up to 60 minutes. ? Begin the bowel prep again. ? Call your health care provider if you keep vomiting or you cannot take the  bowel prep without vomiting.  To clean out your colon, you may also be given: ? Laxative medicines. These help you have a bowel movement. ? Instructions for enema use. An enema is liquid medicine injected into your rectum. Medicines Ask your health care provider about:  Changing or stopping your regular medicines or supplements. This is especially important if you are taking iron supplements, diabetes medicines, or blood thinners.  Taking medicines such as aspirin and ibuprofen. These medicines can thin your blood. Do not take these medicines unless your health care provider tells you to take them.  Taking over-the-counter medicines, vitamins, herbs, and supplements. General instructions  Ask your health care provider what steps will be taken to help prevent infection. These may include washing skin with a germ-killing soap.  Plan to have someone take you home from the hospital or clinic. What happens during the procedure?   An IV will be inserted into one of your veins.  You may be given one or more of the following: ? A medicine to help you relax (sedative). ? A medicine to numb the area (local anesthetic). ? A medicine to make you fall asleep (general anesthetic). This is rarely needed.  You will lie on your side with your knees bent.  The tube will: ? Have oil or gel put on it (be lubricated). ? Be inserted into your anus. ? Be gently eased through all parts of your large intestine.  Air will be sent into your colon to keep it open. This may cause some pressure or cramping.  Images will be taken with the camera and will appear on a screen.  A small tissue sample may be removed to be looked at under a microscope (biopsy). The tissue may be sent to a lab for testing if any signs of problems are found.  If small polyps are found, they may be removed and checked for cancer cells.  When the procedure is finished, the tube will be removed. The procedure may vary among health  care providers and hospitals. What happens after the procedure?  Your blood pressure, heart rate, breathing rate, and blood oxygen level will be monitored until you leave the hospital or clinic.  You may have a small amount of blood in your stool.  You may pass gas and have mild cramping or bloating in your abdomen. This is caused by the air that was used to open your colon during the exam.  Do not drive for 24 hours after the procedure.  It is up to you to get the results of your procedure. Ask your health care provider, or the department that is doing the procedure, when your results will be ready. Summary  A colonoscopy is a procedure to look at the entire large intestine.  Follow instructions from your health care provider about eating and drinking before the procedure.  If you were prescribed  an oral bowel prep to clean out your colon, take it as told by your health care provider.  During the colonoscopy, a flexible tube with a camera on its end is inserted into the anus and then passed into the other parts of the large intestine. This information is not intended to replace advice given to you by your health care provider. Make sure you discuss any questions you have with your health care provider. Document Revised: 07/22/2018 Document Reviewed: 07/22/2018 Elsevier Patient Education  [place].

## 2019-07-14 ENCOUNTER — Encounter: Payer: Medicare (Managed Care) | Admitting: Internal Medicine

## 2019-07-21 ENCOUNTER — Encounter: Payer: Self-pay | Admitting: Internal Medicine

## 2019-07-21 ENCOUNTER — Ambulatory Visit (INDEPENDENT_AMBULATORY_CARE_PROVIDER_SITE_OTHER): Payer: Medicare (Managed Care) | Admitting: Internal Medicine

## 2019-07-21 ENCOUNTER — Other Ambulatory Visit: Payer: Self-pay

## 2019-07-21 ENCOUNTER — Ambulatory Visit (INDEPENDENT_AMBULATORY_CARE_PROVIDER_SITE_OTHER): Payer: Medicare (Managed Care)

## 2019-07-21 VITALS — BP 126/82 | HR 70 | Temp 98.2°F | Ht 75.59 in | Wt 199.2 lb

## 2019-07-21 DIAGNOSIS — Z13220 Encounter for screening for lipoid disorders: Secondary | ICD-10-CM

## 2019-07-21 DIAGNOSIS — Z8719 Personal history of other diseases of the digestive system: Secondary | ICD-10-CM

## 2019-07-21 DIAGNOSIS — M25512 Pain in left shoulder: Secondary | ICD-10-CM | POA: Diagnosis not present

## 2019-07-21 DIAGNOSIS — Z8546 Personal history of malignant neoplasm of prostate: Secondary | ICD-10-CM

## 2019-07-21 DIAGNOSIS — G8929 Other chronic pain: Secondary | ICD-10-CM

## 2019-07-21 DIAGNOSIS — M19012 Primary osteoarthritis, left shoulder: Secondary | ICD-10-CM

## 2019-07-21 DIAGNOSIS — G4730 Sleep apnea, unspecified: Secondary | ICD-10-CM

## 2019-07-21 DIAGNOSIS — K219 Gastro-esophageal reflux disease without esophagitis: Secondary | ICD-10-CM

## 2019-07-21 DIAGNOSIS — Z86010 Personal history of colonic polyps: Secondary | ICD-10-CM

## 2019-07-21 DIAGNOSIS — K432 Incisional hernia without obstruction or gangrene: Secondary | ICD-10-CM

## 2019-07-21 DIAGNOSIS — G4700 Insomnia, unspecified: Secondary | ICD-10-CM | POA: Diagnosis not present

## 2019-07-21 DIAGNOSIS — Z0000 Encounter for general adult medical examination without abnormal findings: Secondary | ICD-10-CM

## 2019-07-21 DIAGNOSIS — K439 Ventral hernia without obstruction or gangrene: Secondary | ICD-10-CM

## 2019-07-21 DIAGNOSIS — Z23 Encounter for immunization: Secondary | ICD-10-CM | POA: Diagnosis not present

## 2019-07-21 DIAGNOSIS — Z1329 Encounter for screening for other suspected endocrine disorder: Secondary | ICD-10-CM

## 2019-07-21 DIAGNOSIS — K317 Polyp of stomach and duodenum: Secondary | ICD-10-CM

## 2019-07-21 DIAGNOSIS — M67912 Unspecified disorder of synovium and tendon, left shoulder: Secondary | ICD-10-CM

## 2019-07-21 DIAGNOSIS — Z1389 Encounter for screening for other disorder: Secondary | ICD-10-CM

## 2019-07-21 MED ORDER — ZOLPIDEM TARTRATE 10 MG PO TABS: 10.0000 mg | ORAL_TABLET | Freq: Every evening | ORAL | 1 refills | Status: DC | PRN

## 2019-07-21 NOTE — Patient Instructions (Addendum)
voltaren gel 4x per day    Tendinitis  Tendinitis is inflammation of a tendon. A tendon is a strong cord of tissue that connects muscle to bone. Tendinitis can affect any tendon, but it most commonly affects the:  Shoulder tendon (rotator cuff).  Ankle tendon (Achilles tendon).  Elbow tendon (triceps tendon).  Tendons in the wrist. What are the causes? This condition may be caused by:  Overusing a tendon or muscle. This is common.  Age-related wear and tear.  Injury.  Inflammatory conditions, such as arthritis.  Certain medicines. What increases the risk? You are more likely to develop this condition if you do activities that involve the same movements over and over again (repetitive motions). What are the signs or symptoms? Symptoms of this condition may include:  Pain.  Tenderness.  Mild swelling.  Decreased range of motion. How is this diagnosed? This condition is diagnosed with a physical exam. You may also have tests, such as:  Ultrasound. This uses sound waves to make an image of the inside of your body in the affected area.  MRI. How is this treated? This condition may be treated by resting, icing, applying pressure (compression), and raising (elevating) the affected area above the level of your heart. This is known as RICE therapy. Treatment may also include:  Medicines to help reduce inflammation or to help reduce pain.  Exercises or physical therapy to strengthen and stretch the tendon.  A brace or splint.  Surgery. This is rarely needed. Follow these instructions at home: If you have a splint or brace:  Wear the splint or brace as told by your health care provider. Remove it only as told by your health care provider.  Loosen the splint or brace if your fingers or toes tingle, become numb, or turn cold and blue.  Keep the splint or brace clean.  If the splint or brace is not waterproof: ? Do not let it get wet. ? Cover it with a watertight  covering when you take a bath or shower. Managing pain, stiffness, and swelling  If directed, put ice on the affected area. ? If you have a removable splint or brace, remove it as told by your health care provider. ? Put ice in a plastic bag. ? Place a towel between your skin and the bag. ? Leave the ice on for 20 minutes, 2-3 times a day.  Move the fingers or toes of the affected limb often, if this applies. This can help to prevent stiffness and lessen swelling.  If directed, raise (elevate) the affected area above the level of your heart while you are sitting or lying down.  If directed, apply heat to the affected area before you exercise. Use the heat source that your health care provider recommends, such as a moist heat pack or a heating pad.     ? Place a towel between your skin and the heat source. ? Leave the heat on for 20-30 minutes. ? Remove the heat if your skin turns bright red. This is especially important if you are unable to feel pain, heat, or cold. You may have a greater risk of getting burned. Driving  Do not drive or use heavy machinery while taking prescription pain medicine.  Ask your health care provider when it is safe to drive if you have a splint or brace on any part of your arm or leg. Activity  Rest the affected area as told by your health care provider.  Return to your  normal activities as told by your health care provider. Ask your health care provider what activities are safe for you.  Avoid using the affected area while you are experiencing symptoms of tendinitis.  Do exercises as told by your health care provider. General instructions  If you have a splint, do not put pressure on any part of the splint until it is fully hardened. This may take several hours.  Wear an elastic bandage or compression wrap only as told by your health care provider.  Take over-the-counter and prescription medicines only as told by your health care provider.  Keep  all follow-up visits as told by your health care provider. This is important. Contact a health care provider if:  Your symptoms do not improve.  You develop new, unexplained problems, such as numbness in your hands. Summary  Tendinitis is inflammation of a tendon.  You are more likely to develop this condition if you do activities that involve the same movements over and over again.  This condition may be treated by resting, icing, applying pressure (compression), and elevating the area above the level of your heart. This is known as RICE therapy.  Avoid using the affected area while you are experiencing symptoms of tendinitis. This information is not intended to replace advice given to you by your health care provider. Make sure you discuss any questions you have with your health care provider. Document Revised: 07/06/2017 Document Reviewed: 05/19/2017 Elsevier Patient Education  [place].  Shoulder Range of Motion Exercises Shoulder range of motion (ROM) exercises are done to keep the shoulder moving freely or to increase movement. They are often recommended for people who have shoulder pain or stiffness or who are recovering from a shoulder surgery. Phase 1 exercises When you are able, do this exercise 1-2 times per day for 30-60 seconds in each direction, or as directed by your health care provider. Pendulum exercise To do this exercise while sitting: 1. Sit in a chair or at the edge of your bed with your feet flat on the floor. 2. Let your affected arm hang down in front of you over the edge of the bed or chair. 3. Relax your shoulder, arm, and hand. [place] your body so your arm gently swings in small circles. You can also use your unaffected arm to start the motion. 5. Repeat changing the direction of the circles, swinging your arm left and right, and swinging your arm forward and back. To do this exercise while standing: 1. Stand next to a sturdy chair or table, and  hold on to it with your hand on your unaffected side. 2. Bend forward at the waist. 3. Bend your knees slightly. 4. Relax your shoulder, arm, and hand. 5. While keeping your shoulder relaxed, use body motion to swing your arm in small circles. 6. Repeat changing the direction of the circles, swinging your arm left and right, and swinging your arm forward and back. 7. Between exercises, stand up tall and take a short break to relax your lower back.  Phase 2 exercises Do these exercises 1-2 times per day or as told by your health care provider. Hold each stretch for 30 seconds, and repeat 3 times. Do the exercises with one or both arms as instructed by your health care provider. For these exercises, sit at a table with your hand and arm supported by the table. A chair that slides easily or has wheels can be helpful. External rotation 1. Turn your chair so  that your affected side is nearest to the table. 2. Place your forearm on the table to your side. Bend your elbow about 90 at the elbow (right angle) and place your hand palm facing down on the table. Your elbow should be about 6 inches away from your side. 3. Keeping your arm on the table, lean your body forward. Abduction 1. Turn your chair so that your affected side is nearest to the table. 2. Place your forearm and hand on the table so that your thumb points toward the ceiling and your arm is straight out to your side. 3. Slide your hand out to the side and away from you, using your unaffected arm to do the work. 4. To increase the stretch, you can slide your chair away from the table. Flexion: forward stretch 1. Sit facing the table. Place your hand and elbow on the table in front of you. 2. Slide your hand forward and away from you, using your unaffected arm to do the work. 3. To increase the stretch, you can slide your chair backward. Phase 3 exercises Do these exercises 1-2 times per day or as told by your health care provider. Hold  each stretch for 30 seconds, and repeat 3 times. Do the exercises with one or both arms as instructed by your health care provider. Cross-body stretch: posterior capsule stretch 1. Lift your arm straight out in front of you. 2. Bend your arm 90 at the elbow (right angle) so your forearm moves across your body. 3. Use your other arm to gently pull the elbow across your body, toward your other shoulder. Wall climbs 1. Stand with your affected arm extended out to the side with your hand resting on a door frame. 2. Slide your hand slowly up the door frame. 3. To increase the stretch, step through the door frame. Keep your body upright and do not lean. Wand exercises You will need a cane, a piece of PVC pipe, or a sturdy wooden dowel for wand exercises. Flexion To do this exercise while standing: 1. Hold the wand with both of your hands, palms down. 2. Using the other arm to help, lift your arms up and over your head, if able. 3. Push upward with your other arm to gently increase the stretch. To do this exercise while lying down: 1. Lie on your back with your elbows resting on the floor and the wand in both your hands. Your hands will be palm down, or pointing toward your feet. 2. Lift your hands toward the ceiling, using your unaffected arm to help if needed. 3. Bring your arms overhead as able, using your unaffected arm to help if needed. Internal rotation 1. Stand while holding the wand behind you with both hands. Your unaffected arm should be extended above your head with the arm of the affected side extended behind you at the level of your waist. The wand should be pointing straight up and down as you hold it. 2. Slowly pull the wand up behind your back by straightening the elbow of your unaffected arm and bending the elbow of your affected arm. External rotation 1. Lie on your back with your affected upper arm supported on a small pillow or rolled towel. When you first do this exercise,  keep your upper arm close to your body. Over time, bring your arm up to a 90 angle out to the side. 2. Hold the wand across your stomach and with both hands palm up. Your elbow on  your affected side should be bent at a 90 angle. 3. Use your unaffected side to help push your forearm away from you and toward the floor. Keep your elbow on your affected side bent at a 90 angle. Contact a health care provider if you have:  New or increasing pain.  New numbness, tingling, weakness, or discoloration in your arm or hand. This information is not intended to replace advice given to you by your health care provider. Make sure you discuss any questions you have with your health care provider. Document Revised: 02/10/2017 Document Reviewed: 02/10/2017 Elsevier Patient Education  [place].  Shoulder Exercises Ask your health care provider which exercises are safe for you. Do exercises exactly as told by your health care provider and adjust them as directed. It is normal to feel mild stretching, pulling, tightness, or discomfort as you do these exercises. Stop right away if you feel sudden pain or your pain gets worse. Do not begin these exercises until told by your health care provider. Stretching exercises External rotation and abduction This exercise is sometimes called corner stretch. This exercise rotates your arm outward (external rotation) and moves your arm out from your body (abduction). 1. Stand in a doorway with one of your feet slightly in front of the other. This is called a staggered stance. If you cannot reach your forearms to the door frame, stand facing a corner of a room. 2. Choose one of the following positions as told by your health care provider: ? Place your hands and forearms on the door frame above your head. ? Place your hands and forearms on the door frame at the height of your head. ? Place your hands on the door frame at the height of your elbows. 3. Slowly move your  weight onto your front foot until you feel a stretch across your chest and in the front of your shoulders. Keep your head and chest upright and keep your abdominal muscles tight. 4. Hold for __________ seconds. 5. To release the stretch, shift your weight to your back foot. Repeat __________ times. Complete this exercise __________ times a day. Extension, standing 1. Stand and hold a broomstick, a cane, or a similar object behind your back. ? Your hands should be a little wider than shoulder width apart. ? Your palms should face away from your back. 2. Keeping your elbows straight and your shoulder muscles relaxed, move the stick away from your body until you feel a stretch in your shoulders (extension). ? Avoid shrugging your shoulders while you move the stick. Keep your shoulder blades tucked down toward the middle of your back. 3. Hold for __________ seconds. 4. Slowly return to the starting position. Repeat __________ times. Complete this exercise __________ times a day. Range-of-motion exercises Pendulum  1. Stand near a wall or a surface that you can hold onto for balance. 2. Bend at the waist and let your left / right arm hang straight down. Use your other arm to support you. Keep your back straight and do not lock your knees. 3. Relax your left / right arm and shoulder muscles, and move your hips and your trunk so your left / right arm swings freely. Your arm should swing because of the motion of your body, not because you are using your arm or shoulder muscles. 4. Keep moving your hips and trunk so your arm swings in the following directions, as told by your health care provider: ? Side to side. ? Forward and  backward. ? In clockwise and counterclockwise circles. 5. Continue each motion for __________ seconds, or for as long as told by your health care provider. 6. Slowly return to the starting position. Repeat __________ times. Complete this exercise __________ times a  day. Shoulder flexion, standing  1. Stand and hold a broomstick, a cane, or a similar object. Place your hands a little more than shoulder width apart on the object. Your left / right hand should be palm up, and your other hand should be palm down. 2. Keep your elbow straight and your shoulder muscles relaxed. Push the stick up with your healthy arm to raise your left / right arm in front of your body, and then over your head until you feel a stretch in your shoulder (flexion). ? Avoid shrugging your shoulder while you raise your arm. Keep your shoulder blade tucked down toward the middle of your back. 3. Hold for __________ seconds. 4. Slowly return to the starting position. Repeat __________ times. Complete this exercise __________ times a day. Shoulder abduction, standing 1. Stand and hold a broomstick, a cane, or a similar object. Place your hands a little more than shoulder width apart on the object. Your left / right hand should be palm up, and your other hand should be palm down. 2. Keep your elbow straight and your shoulder muscles relaxed. Push the object across your body toward your left / right side. Raise your left / right arm to the side of your body (abduction) until you feel a stretch in your shoulder. ? Do not raise your arm above shoulder height unless your health care provider tells you to do that. ? If directed, raise your arm over your head. ? Avoid shrugging your shoulder while you raise your arm. Keep your shoulder blade tucked down toward the middle of your back. 3. Hold for __________ seconds. 4. Slowly return to the starting position. Repeat __________ times. Complete this exercise __________ times a day. Internal rotation  1. Place your left / right hand behind your back, palm up. 2. Use your other hand to dangle an exercise band, a towel, or a similar object over your shoulder. Grasp the band with your left / right hand so you are holding on to both ends. 3. Gently  pull up on the band until you feel a stretch in the front of your left / right shoulder. The movement of your arm toward the center of your body is called internal rotation. ? Avoid shrugging your shoulder while you raise your arm. Keep your shoulder blade tucked down toward the middle of your back. 4. Hold for __________ seconds. 5. Release the stretch by letting go of the band and lowering your hands. Repeat __________ times. Complete this exercise __________ times a day. Strengthening exercises External rotation  1. Sit in a stable chair without armrests. 2. Secure an exercise band to a stable object at elbow height on your left / right side. 3. Place a soft object, such as a folded towel or a small pillow, between your left / right upper arm and your body to move your elbow about 4 inches (10 cm) away from your side. 4. Hold the end of the exercise band so it is tight and there is no slack. 5. Keeping your elbow pressed against the soft object, slowly move your forearm out, away from your abdomen (external rotation). Keep your body steady so only your forearm moves. 6. Hold for __________ seconds. 7. Slowly return to the  starting position. Repeat __________ times. Complete this exercise __________ times a day. Shoulder abduction  1. Sit in a stable chair without armrests, or stand up. 2. Hold a __________ weight in your left / right hand, or hold an exercise band with both hands. 3. Start with your arms straight down and your left / right palm facing in, toward your body. 4. Slowly lift your left / right hand out to your side (abduction). Do not lift your hand above shoulder height unless your health care provider tells you that this is safe. ? Keep your arms straight. ? Avoid shrugging your shoulder while you do this movement. Keep your shoulder blade tucked down toward the middle of your back. 5. Hold for __________ seconds. 6. Slowly lower your arm, and return to the starting  position. Repeat __________ times. Complete this exercise __________ times a day. Shoulder extension 1. Sit in a stable chair without armrests, or stand up. 2. Secure an exercise band to a stable object in front of you so it is at shoulder height. 3. Hold one end of the exercise band in each hand. Your palms should face each other. 4. Straighten your elbows and lift your hands up to shoulder height. 5. Step back, away from the secured end of the exercise band, until the band is tight and there is no slack. 6. Squeeze your shoulder blades together as you pull your hands down to the sides of your thighs (extension). Stop when your hands are straight down by your sides. Do not let your hands go behind your body. 7. Hold for __________ seconds. 8. Slowly return to the starting position. Repeat __________ times. Complete this exercise __________ times a day. Shoulder row 1. Sit in a stable chair without armrests, or stand up. 2. Secure an exercise band to a stable object in front of you so it is at waist height. 3. Hold one end of the exercise band in each hand. Position your palms so that your thumbs are facing the ceiling (neutral position). 4. Bend each of your elbows to a 90-degree angle (right angle) and keep your upper arms at your sides. 5. Step back until the band is tight and there is no slack. 6. Slowly pull your elbows back behind you. 7. Hold for __________ seconds. 8. Slowly return to the starting position. Repeat __________ times. Complete this exercise __________ times a day. Shoulder press-ups  1. Sit in a stable chair that has armrests. Sit upright, with your feet flat on the floor. 2. Put your hands on the armrests so your elbows are bent and your fingers are pointing forward. Your hands should be about even with the sides of your body. 3. Push down on the armrests and use your arms to lift yourself off the chair. Straighten your elbows and lift yourself up as much as you  comfortably can. ? Move your shoulder blades down, and avoid letting your shoulders move up toward your ears. ? Keep your feet on the ground. As you get stronger, your feet should support less of your body weight as you lift yourself up. 4. Hold for __________ seconds. 5. Slowly lower yourself back into the chair. Repeat __________ times. Complete this exercise __________ times a day. Wall push-ups  1. Stand so you are facing a stable wall. Your feet should be about one arm-length away from the wall. 2. Lean forward and place your palms on the wall at shoulder height. 3. Keep your feet flat on the floor  as you bend your elbows and lean forward toward the wall. 4. Hold for __________ seconds. 5. Straighten your elbows to push yourself back to the starting position. Repeat __________ times. Complete this exercise __________ times a day. This information is not intended to replace advice given to you by your health care provider. Make sure you discuss any questions you have with your health care provider. Document Revised: 04/22/2018 Document Reviewed: 01/28/2018 Elsevier Patient Education  [place].

## 2019-07-21 NOTE — Progress Notes (Signed)
Patient here for annual exam. States he is having some shoulder pain but thinks this may be from lifting weights. Rates pain a 5/10 with movement in the left shoulder.   Patient would also like to discuss an rx for something to raise his bed due to sleep apnea.

## 2019-07-21 NOTE — Progress Notes (Signed)
Chief Complaint  Patient presents with   Annual Exam   Medication Refill   F/u, annual reviewed labs 04/21/19  1. Pt wants Rx for elevated head of bed type bed I.e sleep # of tempurpedic bed due to OSA, GERD given Rx today  2. Left umbilical hernia abdomen in prior surgical scar  -wants referral to Dr. Bary Castilla to repair  3. Left shoulder pain 5/10 at times worse with movement/ROM but ROM not restricted. Xray +rotator cuff etiology and osteoarthritis wants referral Dr. Earnestine Leys [facility]. Nothing tried  Of note knee pain resolved  He has worsening pain with overhead movements and behind back   4. Due for GI f/u Dr. Linward Foster will refer today for h/o polyps/colitis FH colon cancer  5. H/o prostate PSA trending up [facility] urology 0.12 to 0.14 to 0.15 04/07/19 with FH prostate cancer as well and personal history -will f/u [facility] urology  6. Chronic insomnia needs refill of ambien 10 mg qhs 7. Of note he and wife just returned from [place] vacation    Review of Systems  Constitutional: Negative for weight loss.  HENT: Negative for hearing loss.   Eyes: Negative for blurred vision.  Respiratory: Negative for shortness of breath.   Cardiovascular: Negative for chest pain.  Gastrointestinal: Negative for abdominal pain.  Musculoskeletal: Positive for joint pain.  Skin: Negative for rash.  Neurological: Negative for headaches.  Psychiatric/Behavioral: Negative for depression. The patient is not nervous/anxious and does not have insomnia.    Past Medical History:  Diagnosis Date   Adult ADHD    Dr. Rosine Door   Allergy    Cancer [facility]) 2012   PROSTATE   Family hx of colon cancer 05/08/2016   GERD (gastroesophageal reflux disease)    History of implantation of artificial sphincter    Hx of malignant neoplasm of prostate 05/08/2016   2012, s/p radical prostatectomy, urologist is Dr. Tresa Endo [facility]   Hx of malignant neoplasm of prostate 05/08/2016   2012, s/p radical  prostatectomy, urologist is Dr. Tresa Endo [facility]   Insomnia    Proteinuria 03/11/2018   Evaluated by nephrologist Feb 2020; may be interstitial, related to pantoprazole use   Sleep apnea    CPAP   Varicose vein of leg    removed veins in 1972   Past Surgical History:  Procedure Laterality Date   artificaill sphincter  12/2015   COLONOSCOPY  2016   HERNIA REPAIR  0998   umbilical    PENILE PROSTHESIS IMPLANT     PROSTATECTOMY  2012   Radical Prostatectomy.   UMBILICAL HERNIA REPAIR N/A 12/11/2016   Procedure: HERNIA REPAIR UMBILICAL ADULT;  Surgeon: Christene Lye, MD;  Location: [facility] ORS;  Service: General;  Laterality: N/A;   VARICOSE VEIN SURGERY Left 1972   Family History  Problem Relation Age of Onset   Cancer Sister        adrenal gland cancer   Cancer Brother        Prostate CA   Pneumonia Mother    Dementia Father    Cancer Brother        lung (smoker)   Cancer Sister        colon   Diabetes Brother    Irritable bowel syndrome Brother    Cancer Brother        prostate - in remission   Social History   Socioeconomic History   Marital status: Married    Spouse name: Not on file  Number of children: Not on file   Years of education: Not on file   Highest education level: Not on file  Occupational History   Not on file  Tobacco Use   Smoking status: Never Smoker   Smokeless tobacco: Never Used  Vaping Use   Vaping Use: Never used  Substance and Sexual Activity   Alcohol use: Yes    Alcohol/week: 2.0 standard drinks    Types: 2 Glasses of wine per week    Comment: WINE OCC   Drug use: No   Sexual activity: Yes    Partners: Female  Other Topics Concern   Not on file  Social History Narrative   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    Daughter and son    daughter lives in [place] and has kids    Son is pharm D   Wife is Pharmacist, community    Social Determinants of Radio broadcast assistant Strain: Low  Risk    Difficulty of Paying Living Expenses: Not hard at all  Food Insecurity: No Food Insecurity   Worried About Charity fundraiser in the Last Year: Never true   Arboriculturist in the Last Year: Never true  Transportation Needs: No Transportation Needs   Lack of Transportation (Medical): No   Lack of Transportation (Non-Medical): No  Physical Activity:    Days of Exercise per Week:    Minutes of Exercise per Session:   Stress: No Stress Concern Present   Feeling of Stress : Not at all  Social Connections: Unknown   Frequency of Communication with Friends and Family: Not on file   Frequency of Social Gatherings with Friends and Family: Not on file   Attends Religious Services: Not on file   Active Member of Clubs or Organizations: Not on file   Attends Archivist Meetings: Not on file   Marital Status: Married  Human resources officer Violence:    Fear of Current or Ex-Partner:    Emotionally Abused:    Physically Abused:    Sexually Abused:    Current Meds  Medication Sig   Humidifier MISC    Melatonin 3 MG TABS Take 3 mg by mouth at bedtime. Reported on 03/21/2015   Multiple Vitamins-Minerals (MULTIVITAMIN WITH MINERALS) tablet Take 1 tablet daily by mouth.    OMEGA-3 FATTY ACIDS PO Take by mouth.   OVER THE COUNTER MEDICATION Take 1 tablet by mouth daily as needed. Privigin - for memory    pantoprazole (PROTONIX) 40 MG tablet Take 1 tablet (40 mg total) by mouth daily. 30 minutes   Triamcinolone Acetonide (NASACORT ALLERGY 24HR NA) Place into the nose as needed.   Turmeric 500 MG CAPS Take 500 mg by mouth daily.   Allergies  Allergen Reactions   Chocolate Flavor Other (See Comments)    Cysts under the skin.   Tetracyclines & Related Hives and Rash   No results found for this or any previous visit (from the past 2160 hour(s)). Objective  Body mass index is 24.51 kg/m. Wt Readings from Last 3 Encounters:  07/21/19 199 lb 3.2 oz (90.4  kg)  06/02/19 191 lb (86.6 kg)  01/03/19 191 lb (86.6 kg)   Temp Readings from Last 3 Encounters:  07/21/19 98.2 F (36.8 C) (Oral)  01/03/18 97.9 F (36.6 C) (Oral)  06/18/17 (!) 97.5 F (36.4 C) (Oral)   BP Readings from Last 3 Encounters:  07/21/19 126/82  06/02/19 (!) 165/97  01/03/19 125/81  Pulse Readings from Last 3 Encounters:  07/21/19 70  06/02/19 64  01/03/18 81    Physical Exam Vitals and nursing note reviewed.  Constitutional:      Appearance: Normal appearance. He is well-developed and well-groomed.  HENT:     Head: Normocephalic and atraumatic.  Eyes:     Conjunctiva/sclera: Conjunctivae normal.     Pupils: Pupils are equal, round, and reactive to light.  Cardiovascular:     Rate and Rhythm: Normal rate and regular rhythm.     Heart sounds: Normal heart sounds. No murmur heard.   Pulmonary:     Effort: Pulmonary effort is normal.     Breath sounds: Normal breath sounds.  Abdominal:     General: Abdomen is flat. Bowel sounds are normal.     Tenderness: There is no abdominal tenderness.  Musculoskeletal:     Left shoulder: Normal range of motion.     Comments: Left shoulder pain with ROm overhead, behind back   Skin:    General: Skin is warm and dry.  Neurological:     General: No focal deficit present.     Mental Status: He is alert and oriented to person, place, and time. Mental status is at baseline.     Gait: Gait normal.  Psychiatric:        Attention and Perception: Attention and perception normal.        Mood and Affect: Mood and affect normal.        Speech: Speech normal.        Behavior: Behavior normal. Behavior is cooperative.        Thought Content: Thought content normal.        Cognition and Memory: Cognition and memory normal.        Judgment: Judgment normal.     Assessment  Plan  Chronic left shoulder pain - Plan: DG Shoulder Left, Ambulatory referral to Orthopedic Surgery Dr. Sabra Heck or Michelle Nasuti +arthritis and rotator  cuff d/o   Insomnia, unspecified type - Plan: zolpidem (AMBIEN) 10 MG tablet  Incisional hernia left of umbilicus, without obstruction or gangrene - Plan: Ambulatory referral to General Surgery Dr. Bary Castilla  Sleep apnea, GERD Given Rx sleep # or tempurpedic bed  Gastric polyps/colitis   Hx of malignant neoplasm of prostate  F/u [facility] urology upcoming PSA trending up as of 04/07/19 0.15 and may need radiation again s/p h/o prostate cancer   HM Flu shot utd had in 10/2018  pna 23 utd  prevar given today shingrix 2/2 utd  rec Tdap sent to pharmacy still due  covid 2/2   H/o Prostate cancer s/p radical prostectomy getting PSA [facility] urology Dr. Lawerance Bach saw 12/2018 due to f/u in 3-6 months PSA 0.12 and 0.14 as of 01/02/19, 04/07/19 0.15 may need radiation in the future appt had 07/24/19 PSA done again repeat in 6 months  02/05/20 hep C 07/01/18 Colonoscopy 05/2016 [facility] Dr. Alain Marion polyps, gastric polyps, mild colitis he f/u with Dr. Alain Marion EGD had 11/2017 Dr. Alain Marion  -07/2019 refer [facility]   Labs 07/01/2018 see care everywhere  CMET normal Na 136, Na 3.9, Cl 104, Co2 24, BUN 11, glucose 82, Cr 0.74, Ca 9.2 TP 7.9, Alb 4.3, TB 0.4, AST 26, ALT 35, AP 70  CBC 5.1/14.1/41.7/171 PSA 0.12  LDL 64 TC 160, TG 105, HLD >40 TSH 1.407 normal  Vitamin D 46 UA had 01/24/2018 normal   Specialists  Surgery Dr. Bary Castilla  Kid[facility] Dr. Candiss Norse  [facility]. Lawerance Bach  [facility]th[facility] Dr. Haskell Flirt [facility] Dr. Alain Marion Provider: Dr. Olivia Mackie McLean-Scocuzza-Internal Medicine

## 2019-07-24 ENCOUNTER — Encounter: Payer: Self-pay | Admitting: Internal Medicine

## 2019-07-24 DIAGNOSIS — Z8719 Personal history of other diseases of the digestive system: Secondary | ICD-10-CM | POA: Insufficient documentation

## 2019-07-24 DIAGNOSIS — M67912 Unspecified disorder of synovium and tendon, left shoulder: Secondary | ICD-10-CM | POA: Insufficient documentation

## 2019-07-24 DIAGNOSIS — M25512 Pain in left shoulder: Secondary | ICD-10-CM | POA: Insufficient documentation

## 2019-07-24 DIAGNOSIS — G8929 Other chronic pain: Secondary | ICD-10-CM | POA: Insufficient documentation

## 2019-07-24 DIAGNOSIS — M19012 Primary osteoarthritis, left shoulder: Secondary | ICD-10-CM | POA: Insufficient documentation

## 2019-07-24 DIAGNOSIS — Z86010 Personal history of colonic polyps: Secondary | ICD-10-CM | POA: Insufficient documentation

## 2019-10-02 ENCOUNTER — Other Ambulatory Visit: Payer: Self-pay | Admitting: Internal Medicine

## 2019-10-02 DIAGNOSIS — G4700 Insomnia, unspecified: Secondary | ICD-10-CM

## 2019-10-02 MED ORDER — ZOLPIDEM TARTRATE 10 MG PO TABS: 10.0000 mg | ORAL_TABLET | Freq: Every evening | ORAL | 5 refills | Status: DC | PRN

## 2020-01-11 ENCOUNTER — Telehealth: Payer: Self-pay | Admitting: Internal Medicine

## 2020-01-11 ENCOUNTER — Other Ambulatory Visit: Payer: Self-pay

## 2020-01-11 DIAGNOSIS — K219 Gastro-esophageal reflux disease without esophagitis: Secondary | ICD-10-CM

## 2020-01-11 MED ORDER — PANTOPRAZOLE SODIUM 40 MG PO TBEC: 40.0000 mg | DELAYED_RELEASE_TABLET | Freq: Every day | ORAL | 3 refills | Status: DC

## 2020-01-11 NOTE — Telephone Encounter (Signed)
Patient needs a refill on his pantoprazole (PROTONIX) 40 MG tablet.

## 2020-01-11 NOTE — Telephone Encounter (Signed)
I have sent for patient.  

## 2020-01-18 ENCOUNTER — Telehealth: Payer: Self-pay

## 2020-01-18 NOTE — Telephone Encounter (Signed)
Faxed prescriber response form to [facility] [phone] about Zolpidem Tartrate on 01/17/20

## 2020-03-22 ENCOUNTER — Other Ambulatory Visit: Payer: Self-pay

## 2020-03-22 ENCOUNTER — Encounter: Payer: Self-pay | Admitting: Internal Medicine

## 2020-03-22 ENCOUNTER — Ambulatory Visit (INDEPENDENT_AMBULATORY_CARE_PROVIDER_SITE_OTHER): Payer: Medicare (Managed Care) | Admitting: Internal Medicine

## 2020-03-22 VITALS — BP 112/84 | HR 92 | Temp 98.4°F | Ht 75.9 in | Wt 191.8 lb

## 2020-03-22 DIAGNOSIS — G4700 Insomnia, unspecified: Secondary | ICD-10-CM | POA: Diagnosis not present

## 2020-03-22 DIAGNOSIS — Z1389 Encounter for screening for other disorder: Secondary | ICD-10-CM

## 2020-03-22 DIAGNOSIS — Z13220 Encounter for screening for lipoid disorders: Secondary | ICD-10-CM

## 2020-03-22 DIAGNOSIS — Z1329 Encounter for screening for other suspected endocrine disorder: Secondary | ICD-10-CM

## 2020-03-22 DIAGNOSIS — Z8546 Personal history of malignant neoplasm of prostate: Secondary | ICD-10-CM

## 2020-03-22 DIAGNOSIS — Z0000 Encounter for general adult medical examination without abnormal findings: Secondary | ICD-10-CM

## 2020-03-22 MED ORDER — ZOLPIDEM TARTRATE 10 MG PO TABS: 10.0000 mg | ORAL_TABLET | Freq: Every evening | ORAL | 5 refills | Status: DC | PRN

## 2020-03-22 NOTE — Patient Instructions (Signed)
Referral to [facility]

## 2020-03-22 NOTE — Progress Notes (Signed)
Chief Complaint  Patient presents with  . Follow-up   F/u  1. H/o prostatectomy and PSA increased from 0.15 to 0.17 02/08/20 f/u urology [facility] will refer [facility] prostate oncology   2. Chronic insomnia needs ambien 10 mg qhs refilled  3. Fasting labs due 04/20/20  4. Left shoulder pain improved but still not 100% disc consider Dr. Raliegh Ip [facility] and PT in the future he saw Dr. Sabra Heck    Review of Systems  Constitutional: Negative for weight loss.  HENT: Negative for hearing loss.   Eyes: Negative for blurred vision.  Respiratory: Negative for shortness of breath.   Cardiovascular: Negative for chest pain.  Gastrointestinal: Negative for abdominal pain.  Musculoskeletal: Negative for falls and joint pain.  Skin: Negative for rash.  Neurological: Negative for headaches.  Psychiatric/Behavioral: Negative for depression.   Past Medical History:  Diagnosis Date  . Adult ADHD    Dr. Rosine Door  . Allergy   . Cancer ([place]) 2012   PROSTATE  . Family hx of colon cancer 05/08/2016  . GERD (gastroesophageal reflux disease)   . History of implantation of artificial sphincter   . Hx of malignant neoplasm of prostate 05/08/2016   2012, s/p radical prostatectomy, urologist is Dr. Tresa Endo [facility]  . Hx of malignant neoplasm of prostate 05/08/2016   2012, s/p radical prostatectomy, urologist is Dr. Tresa Endo [facility]  . Insomnia   . Proteinuria 03/11/2018   Evaluated by nephrologist Feb 2020; may be interstitial, related to pantoprazole use  . Sleep apnea    CPAP  . Varicose vein of leg    removed veins in 1972   Past Surgical History:  Procedure Laterality Date  . artificaill sphincter  12/2015  . COLONOSCOPY  2016  . HERNIA REPAIR  4010   umbilical   . PENILE PROSTHESIS IMPLANT    . PROSTATECTOMY  2012   Radical Prostatectomy.  Marland Kitchen UMBILICAL HERNIA REPAIR N/A 12/11/2016   Procedure: HERNIA REPAIR UMBILICAL ADULT;  Surgeon: Christene Lye, MD;  Location: [facility] ORS;  Service:  General;  Laterality: N/A;  . VARICOSE VEIN SURGERY Left 1972   Family History  Problem Relation Age of Onset  . Cancer Sister        adrenal gland cancer  . Cancer Brother        Prostate CA  . Pneumonia Mother   . Dementia Father   . Cancer Brother        lung (smoker)  . Pneumonia Brother        CAP  . Cancer Sister        colon  . Diabetes Brother   . Irritable bowel syndrome Brother   . Cancer Brother        prostate - in remission   Social History   Socioeconomic History  . Marital status: Married    Spouse name: Not on file  . Number of children: Not on file  . Years of education: Not on file  . Highest education level: Not on file  Occupational History  . Not on file  Tobacco Use  . Smoking status: Never Smoker  . Smokeless tobacco: Never Used  Vaping Use  . Vaping Use: Never used  Substance and Sexual Activity  . Alcohol use: Yes    Alcohol/week: 2.0 standard drinks    Types: 2 Glasses of wine per week    Comment: WINE OCC  . Drug use: No  . Sexual activity: Yes    Partners: Female  Other Topics Concern  . 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 Difficulty of Paying Living Expenses: Not hard at all  Food Insecurity: No Food Insecurity  . Worried About Charity fundraiser in the Last Year: Never true  . Ran Out of Food in the Last Year: Never true  Transportation Needs: No Transportation Needs  . Lack of Transportation (Medical): No  . Lack of Transportation (Non-Medical): No  Physical Activity: Not on file  Stress: No Stress Concern Present  . Feeling of Stress : Not at all  Social Connections: Unknown  . Frequency of Communication with Friends and Family: Not on file  . Frequency of Social Gatherings with Friends and  Family: Not on file  . Attends Religious Services: Not on file  . Active Member of Clubs or Organizations: Not on file  . Attends Archivist Meetings: Not on file  . Marital Status: Married  Human resources officer Violence: Not on file   Current Meds  Medication Sig  . Humidifier MISC   . Melatonin 3 MG TABS Take 3 mg by mouth at bedtime. Reported on 03/21/2015  . Multiple Vitamins-Minerals (MULTIVITAMIN WITH MINERALS) tablet Take 1 tablet daily by mouth.   . OMEGA-3 FATTY ACIDS PO Take by mouth.  Marland Kitchen OVER THE COUNTER MEDICATION Take 1 tablet by mouth daily as needed. Privigin - for memory  . pantoprazole (PROTONIX) 40 MG tablet Take 1 tablet (40 mg total) by mouth daily. 30 minutes  . Triamcinolone Acetonide (NASACORT ALLERGY 24HR NA) Place into the nose as needed.  . Turmeric 500 MG CAPS Take 500 mg by mouth daily.   Allergies  Allergen Reactions  . Chocolate Flavor Other (See Comments)    Cysts under the skin.  . Tetracyclines & Related Hives and Rash   No results found for this or any previous visit (from the past 2160 hour(s)). Objective  Body mass index is 23.41 kg/m. Wt Readings from Last 3 Encounters:  03/22/20 191 lb 12.8 oz (87 kg)  07/21/19 199 lb 3.2 oz (90.4 kg)  06/02/19 191 lb (86.6 kg)   Temp Readings from Last 3 Encounters:  03/22/20 98.4 F (36.9 C) (Oral)  07/21/19 98.2 F (36.8 C) (Oral)  01/03/18 97.9 F (36.6 C) (Oral)   BP Readings from Last 3 Encounters:  03/22/20 112/84  07/21/19 126/82  06/02/19 (!) 165/97   Pulse Readings from Last 3 Encounters:  03/22/20 92  07/21/19 70  06/02/19 64    Physical Exam Vitals and nursing note reviewed.  Constitutional:      Appearance: Normal appearance. He is well-developed and well-groomed.  HENT:     Head: Normocephalic and atraumatic.  Eyes:     Conjunctiva/sclera: Conjunctivae normal.     Pupils: Pupils are equal, round, and reactive to light.  Cardiovascular:     Rate and Rhythm: Normal rate  and regular rhythm.     Heart sounds: Normal heart sounds. No murmur heard.   Pulmonary:     Effort: Pulmonary effort is normal.     Breath sounds: Normal breath sounds.  Abdominal:     Tenderness: There is no abdominal tenderness.  Skin:  General: Skin is warm and dry.  Neurological:     General: No focal deficit present.     Mental Status: He is alert and oriented to person, place, and time. Mental status is at baseline.     Gait: Gait normal.  Psychiatric:        Attention and Perception: Attention and perception normal.        Mood and Affect: Mood and affect normal.        Speech: Speech normal.        Behavior: Behavior normal. Behavior is cooperative.        Thought Content: Thought content normal.        Cognition and Memory: Cognition and memory normal.        Judgment: Judgment normal.     Assessment  Plan  Annual physical exam - Plan: Comprehensive metabolic panel, CBC with Differential/Platelet, Urinalysis, Routine w reflex microscopic, TSH, Lipid panel Flu shot utd pna 23 utd  prevar utd shingrix 2/2 utd  tdap given Rx today covid 3/3  H/o Prostate cancers/p radical prostectomygetting PSA [facility] Dr. Lawerance Bach saw 12/2018 due to f/u in 3-6 months PSA 0.12 and 0.14 as of 01/02/19, 04/07/19 0.15 may need radiation in the future appt had 07/24/19 PSA done again repeat in 6 months  02/08/20 0.17  -referred Dr. Lawanna Kobus [facility] 03/22/20 Hx of malignant neoplasm of prostate  F/u [facility] urology upcoming PSA trending up as of 04/07/19 0.15 and may need radiation again s/p h/o prostate cancer   hep C6/19/20 Colonoscopy [facility], gastric polyps, mild colitis he f/u with Dr. Alain Marion EGD had 11/2017 Dr. Alain Marion  -07/2019 [facility] had 8/26/21adenomatous and hyperplastic polyp GI bx negative f/u EGD 3 years, colonoscopy 5 years.   rec healthy diet and exercise   Insomnia, unspecified type - Plan: zolpidem (AMBIEN) 10 MG  tablet     Specialists  Surgery Dr. Bary Castilla  Kidney Dr. Candiss Norse  [facility]. Lawerance Bach  [facility] Dr. Haskell Flirt [facility] Dr. Doreen Beam oncology Dr. Lawanna Kobus   Provider: Dr. Olivia Mackie McLean-Scocuzza-Internal Medicine

## 2020-04-27 LAB — COMPREHENSIVE METABOLIC PANEL
ALT: 32 IU/L (ref 0–44)
AST: 31 IU/L (ref 0–40)
Albumin/Globulin Ratio: 1.3 (ref 1.2–2.2)
Albumin: 4.4 g/dL (ref 3.8–4.8)
Alkaline Phosphatase: 74 IU/L (ref 44–121)
BUN/Creatinine Ratio: 13 (ref 10–24)
BUN: 12 mg/dL (ref 8–27)
Bilirubin Total: 0.4 mg/dL (ref 0.0–1.2)
CO2: 22 mmol/L (ref 20–29)
Calcium: 9.7 mg/dL (ref 8.6–10.2)
Chloride: 103 mmol/L (ref 96–106)
Creatinine, Ser: 0.92 mg/dL (ref 0.76–1.27)
Globulin, Total: 3.3 g/dL (ref 1.5–4.5)
Glucose: 98 mg/dL (ref 65–99)
Potassium: 4.6 mmol/L (ref 3.5–5.2)
Sodium: 142 mmol/L (ref 134–144)
Total Protein: 7.7 g/dL (ref 6.0–8.5)
eGFR: 91 mL/min/{1.73_m2} (ref >59)

## 2020-04-27 LAB — URINALYSIS, ROUTINE W REFLEX MICROSCOPIC
Bilirubin, UA: NEGATIVE
Glucose, UA: NEGATIVE
Ketones, UA: NEGATIVE
Leukocytes,UA: NEGATIVE
Nitrite, UA: NEGATIVE
RBC, UA: NEGATIVE
Specific Gravity, UA: 1.025 (ref 1.005–1.030)
Urobilinogen, Ur: 0.2 mg/dL (ref 0.2–1.0)
pH, UA: 6 (ref 5.0–7.5)

## 2020-04-27 LAB — CBC WITH DIFFERENTIAL/PLATELET
Basophils Absolute: 0 10*3/uL (ref 0.0–0.2)
Basos: 1 %
EOS (ABSOLUTE): 0.1 10*3/uL (ref 0.0–0.4)
Eos: 2 %
Hematocrit: 41.8 % (ref 37.5–51.0)
Hemoglobin: 13.9 g/dL (ref 13.0–17.7)
Immature Grans (Abs): 0 10*3/uL (ref 0.0–0.1)
Immature Granulocytes: 0 %
Lymphocytes Absolute: 1.6 10*3/uL (ref 0.7–3.1)
Lymphs: 32 %
MCH: 29 pg (ref 26.6–33.0)
MCHC: 33.3 g/dL (ref 31.5–35.7)
MCV: 87 fL (ref 79–97)
Monocytes Absolute: 0.4 10*3/uL (ref 0.1–0.9)
Monocytes: 9 %
Neutrophils Absolute: 2.8 10*3/uL (ref 1.4–7.0)
Neutrophils: 56 %
Platelets: 172 10*3/uL (ref 150–450)
RBC: 4.79 x10E6/uL (ref 4.14–5.80)
RDW: 14.7 % (ref 11.6–15.4)
WBC: 5 10*3/uL (ref 3.4–10.8)

## 2020-04-27 LAB — LIPID PANEL
Chol/HDL Ratio: 3.3 ratio (ref 0.0–5.0)
Cholesterol, Total: 143 mg/dL (ref 100–199)
HDL: 44 mg/dL (ref >39)
LDL Chol Calc (NIH): 82 mg/dL (ref 0–99)
Triglycerides: 89 mg/dL (ref 0–149)
VLDL Cholesterol Cal: 17 mg/dL (ref 5–40)

## 2020-04-27 LAB — TSH: TSH: 2.31 u[IU]/mL (ref 0.450–4.500)

## 2020-05-10 ENCOUNTER — Telehealth: Payer: Self-pay | Admitting: Internal Medicine

## 2020-05-10 NOTE — Telephone Encounter (Signed)
Form done no charge

## 2020-05-10 NOTE — Telephone Encounter (Signed)
Form was collected and placed in PCPs box for review.

## 2020-05-10 NOTE — Telephone Encounter (Signed)
Patient dropped off a insurance form to be signed by Dr. Aundra Dubin. Form is up front in Dr. Claris Gladden color folder.

## 2020-05-13 NOTE — Telephone Encounter (Signed)
Patient informed and form placed at front desk for pick up.   Patient had to cancel annual wellness fist that was scheduled for 06/04/20. Patient rescheduled to 06/14/20 at 8:15 am

## 2020-05-20 NOTE — Telephone Encounter (Signed)
Noted  

## 2020-06-04 ENCOUNTER — Ambulatory Visit: Payer: Medicare (Managed Care)

## 2020-06-14 ENCOUNTER — Ambulatory Visit: Payer: Medicare (Managed Care)

## 2020-06-21 ENCOUNTER — Telehealth: Payer: Self-pay

## 2020-06-21 ENCOUNTER — Ambulatory Visit (INDEPENDENT_AMBULATORY_CARE_PROVIDER_SITE_OTHER): Payer: 59

## 2020-06-21 VITALS — Ht 75.9 in | Wt 191.0 lb

## 2020-06-21 DIAGNOSIS — Z0000 Encounter for general adult medical examination without abnormal findings: Secondary | ICD-10-CM

## 2020-06-21 NOTE — Patient Instructions (Addendum)
Brandon Huffman , Thank you for taking time to come for your Medicare Wellness Visit. I appreciate your ongoing commitment to your health goals. Please review the following plan we discussed and let me know if I can assist you in the future.   These are the goals we discussed:  Goals       Patient Stated     Increase physical activity (pt-stated)        This is a list of the screening recommended for you and due dates:  Health Maintenance  Topic Date Due   Tetanus Vaccine  08/03/2020   Flu Shot  08/12/2020   COVID-19 Vaccine (5 - Booster for Moderna series) 08/12/2020   Colon Cancer Screening  09/07/2022   Hepatitis C Screening: USPSTF Recommendation to screen - Ages 18-79 yo.  Completed   Pneumonia vaccines  Completed   Zoster (Shingles) Vaccine  Completed   HPV Vaccine  Aged Out    Advanced directives: End of life planning; Advance aging; Advanced directives discussed.  Copy of current HCPOA/Living Will requested.    Conditions/risks identified: none new  Follow up in one year for your annual wellness visit.   Preventive Care 68 Years and Older, Male Preventive care refers to lifestyle choices and visits with your health care provider that can promote health and wellness. What does preventive care include? A yearly physical exam. This is also called an annual well check. Dental exams once or twice a year. Routine eye exams. Ask your health care provider how often you should have your eyes checked. Personal lifestyle choices, including: Daily care of your teeth and gums. Regular physical activity. Eating a healthy diet. Avoiding tobacco and drug use. Limiting alcohol use. Practicing safe sex. Taking low doses of aspirin every day. Taking vitamin and mineral supplements as recommended by your health care provider. What happens during an annual well check? The services and screenings done by your health care provider during your annual well check will depend on your age,  overall health, lifestyle risk factors, and family history of disease. Counseling  Your health care provider may ask you questions about your: Alcohol use. Tobacco use. Drug use. Emotional well-being. Home and relationship well-being. Sexual activity. Eating habits. History of falls. Memory and ability to understand (cognition). Work and work Statistician. Screening  You may have the following tests or measurements: Height, weight, and BMI. Blood pressure. Lipid and cholesterol levels. These may be checked every 5 years, or more frequently if you are over 87 years old. Skin check. Lung cancer screening. You may have this screening every year starting at age 84 if you have a 30-pack-year history of smoking and currently smoke or have quit within the past 15 years. Fecal occult blood test (FOBT) of the stool. You may have this test every year starting at age 66. Flexible sigmoidoscopy or colonoscopy. You may have a sigmoidoscopy every 5 years or a colonoscopy every 10 years starting at age 31. Prostate cancer screening. Recommendations will vary depending on your family history and other risks. Hepatitis C blood test. Hepatitis B blood test. Sexually transmitted disease (STD) testing. Diabetes screening. This is done by checking your blood sugar (glucose) after you have not eaten for a while (fasting). You may have this done every 1-3 years. Abdominal aortic aneurysm (AAA) screening. You may need this if you are a current or former smoker. Osteoporosis. You may be screened starting at age 79 if you are at high risk. Talk with your health care  provider about your test results, treatment options, and if necessary, the need for more tests. Vaccines  Your health care provider may recommend certain vaccines, such as: Influenza vaccine. This is recommended every year. Tetanus, diphtheria, and acellular pertussis (Tdap, Td) vaccine. You may need a Td booster every 10 years. Zoster vaccine.  You may need this after age 87. Pneumococcal 13-valent conjugate (PCV13) vaccine. One dose is recommended after age 4. Pneumococcal polysaccharide (PPSV23) vaccine. One dose is recommended after age 74. Talk to your health care provider about which screenings and vaccines you need and how often you need them. This information is not intended to replace advice given to you by your health care provider. Make sure you discuss any questions you have with your health care provider. Document Released: 01/25/2015 Document Revised: 09/18/2015 Document Reviewed: 10/30/2014 Elsevier Interactive Patient Education  2017 [employer] Prevention in the Home Falls can cause injuries. They can happen to people of all ages. There are many things you can do to make your home safe and to help prevent falls. What can I do on the outside of my home? Regularly fix the edges of walkways and driveways and fix any cracks. Remove anything that might make you trip as you walk through a door, such as a raised step or threshold. Trim any bushes or trees on the path to your home. Use bright outdoor lighting. Clear any walking paths of anything that might make someone trip, such as rocks or tools. Regularly check to see if handrails are loose or broken. Make sure that both sides of any steps have handrails. Any raised decks and porches should have guardrails on the edges. Have any leaves, snow, or ice cleared regularly. Use sand or salt on walking paths during winter. Clean up any spills in your garage right away. This includes oil or grease spills. What can I do in the bathroom? Use night lights. Install grab bars by the toilet and in the tub and shower. Do not use towel bars as grab bars. Use non-skid mats or decals in the tub or shower. If you need to sit down in the shower, use a plastic, non-slip stool. Keep the floor dry. Clean up any water that spills on the floor as soon as it happens. Remove soap  buildup in the tub or shower regularly. Attach bath mats securely with double-sided non-slip rug tape. Do not have throw rugs and other things on the floor that can make you trip. What can I do in the bedroom? Use night lights. Make sure that you have a light by your bed that is easy to reach. Do not use any sheets or blankets that are too big for your bed. They should not hang down onto the floor. Have a firm chair that has side arms. You can use this for support while you get dressed. Do not have throw rugs and other things on the floor that can make you trip. What can I do in the kitchen? Clean up any spills right away. Avoid walking on wet floors. Keep items that you use a lot in easy-to-reach places. If you need to reach something above you, use a strong step stool that has a grab bar. Keep electrical cords out of the way. Do not use floor polish or wax that makes floors slippery. If you must use wax, use non-skid floor wax. Do not have throw rugs and other things on the floor that can make you trip. What can I  do with my stairs? Do not leave any items on the stairs. Make sure that there are handrails on both sides of the stairs and use them. Fix handrails that are broken or loose. Make sure that handrails are as long as the stairways. Check any carpeting to make sure that it is firmly attached to the stairs. Fix any carpet that is loose or worn. Avoid having throw rugs at the top or bottom of the stairs. If you do have throw rugs, attach them to the floor with carpet tape. Make sure that you have a light switch at the top of the stairs and the bottom of the stairs. If you do not have them, ask someone to add them for you. What else can I do to help prevent falls? Wear shoes that: Do not have high heels. Have rubber bottoms. Are comfortable and fit you well. Are closed at the toe. Do not wear sandals. If you use a stepladder: Make sure that it is fully opened. Do not climb a closed  stepladder. Make sure that both sides of the stepladder are locked into place. Ask someone to hold it for you, if possible. Clearly mark and make sure that you can see: Any grab bars or handrails. First and last steps. Where the edge of each step is. Use tools that help you move around (mobility aids) if they are needed. These include: Canes. Walkers. Scooters. Crutches. Turn on the lights when you go into a dark area. Replace any light bulbs as soon as they burn out. Set up your furniture so you have a clear path. Avoid moving your furniture around. If any of your floors are uneven, fix them. If there are any pets around you, be aware of where they are. Review your medicines with your doctor. Some medicines can make you feel dizzy. This can increase your chance of falling. Ask your doctor what other things that you can do to help prevent falls. This information is not intended to replace advice given to you by your health care provider. Make sure you discuss any questions you have with your health care provider. Document Released: 10/25/2008 Document Revised: 06/06/2015 Document Reviewed: 02/02/2014 Elsevier Interactive Patient Education  2017 [employer].

## 2020-06-21 NOTE — Progress Notes (Signed)
Subjective:   Brandon Huffman is a 70 y.o. male who presents for Medicare Annual/Subsequent preventive examination.  Review of Systems    No ROS.  Medicare Wellness    Cardiac Risk Factors include: advanced age (>43men, >21 women);male gender     Objective:    Today's Vitals   06/21/20 0823  Weight: 191 lb (86.6 kg)  Height: 6' 3.9" (1.928 m)   Body mass index is 23.31 kg/m.  Advanced Directives 06/21/2020 06/02/2019 12/11/2016 12/02/2016 10/29/2016 05/08/2016 03/21/2015  Does Patient Have a Medical Advance Directive? Yes Yes Yes Yes Yes Yes Yes  Type of Paramedic of [place];Living will [place];Living will [place];Living will [place];Living will  Does patient want to make changes to medical advance directive? No - Patient declined No - Patient declined No - Patient declined - - - -  Copy of [place] in Chart? No - copy requested No - copy requested No - copy requested No - copy requested - - -    Current Medications (verified) Outpatient Encounter Medications as of 06/21/2020  Medication Sig   Humidifier MISC    Melatonin 3 MG TABS Take 3 mg by mouth at bedtime. Reported on 03/21/2015   Multiple Vitamins-Minerals (MULTIVITAMIN WITH MINERALS) tablet Take 1 tablet daily by mouth.    OMEGA-3 FATTY ACIDS PO Take by mouth.   OVER THE COUNTER MEDICATION Take 1 tablet by mouth daily as needed. Privigin - for memory   pantoprazole (PROTONIX) 40 MG tablet Take 1 tablet (40 mg total) by mouth daily. 30 minutes   Triamcinolone Acetonide (NASACORT ALLERGY 24HR NA) Place into the nose as needed.   Turmeric 500 MG CAPS Take 500 mg by mouth daily.   zolpidem (AMBIEN) 10 MG tablet Take 1 tablet (10 mg total) by mouth at bedtime as needed for sleep.   No facility-administered encounter medications on file as of 06/21/2020.    Allergies (verified) Chocolate  flavor and Tetracyclines & related   History: Past Medical History:  Diagnosis Date   Adult ADHD    Dr. Rosine Door   Allergy    Cancer [facility]) 2012   PROSTATE   Family hx of colon cancer 05/08/2016   GERD (gastroesophageal reflux disease)    History of implantation of artificial sphincter    Hx of malignant neoplasm of prostate 05/08/2016   2012, s/p radical prostatectomy, urologist is Dr. Tresa Endo [facility]   Hx of malignant neoplasm of prostate 05/08/2016   2012, s/p radical prostatectomy, urologist is Dr. Tresa Endo [facility]   Insomnia    Proteinuria 03/11/2018   Evaluated by nephrologist Feb 2020; may be interstitial, related to pantoprazole use   Sleep apnea    CPAP   Varicose vein of leg    removed veins in 1972   Past Surgical History:  Procedure Laterality Date   artificaill sphincter  12/2015   COLONOSCOPY  2016   HERNIA REPAIR  1093   umbilical    PENILE PROSTHESIS IMPLANT     PROSTATECTOMY  2012   Radical Prostatectomy.   UMBILICAL HERNIA REPAIR N/A 12/11/2016   Procedure: HERNIA REPAIR UMBILICAL ADULT;  Surgeon: Christene Lye, MD;  Location: [facility] ORS;  Service: General;  Laterality: N/A;   VARICOSE VEIN SURGERY Left 1972   Family History  Problem Relation Age of Onset   Cancer Sister        adrenal gland  cancer   Cancer Brother        Prostate CA   Pneumonia Mother    Dementia Father    Cancer Brother        lung (smoker)   Pneumonia Brother        CAP   Cancer Sister        colon   Diabetes Brother    Irritable bowel syndrome Brother    Cancer Brother        prostate - in remission   Social History   Socioeconomic History   Marital status: Married    Spouse name: Not on file   Number of children: Not on file   Years of education: Not on file   Highest education level: Not on file  Occupational History   Not on file  Tobacco Use   Smoking status: Never   Smokeless tobacco: Never  Vaping Use   Vaping Use: Never used  Substance and  Sexual Activity   Alcohol use: Yes    Alcohol/week: 2.0 standard drinks    Types: 2 Glasses of wine per week    Comment: WINE OCC   Drug use: No   Sexual activity: Yes    Partners: Female  Other Topics Concern   Not on file  Social History Narrative   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    Daughter and son    daughter lives in [place] and has kids    Son is pharm D   Wife is Pharmacist, community    Social Determinants of Radio broadcast assistant Strain: Low Risk    Difficulty of Paying Living Expenses: Not hard at all  Food Insecurity: No Food Insecurity   Worried About Charity fundraiser in the Last Year: Never true   Arboriculturist in the Last Year: Never true  Transportation Needs: No Transportation Needs   Lack of Transportation (Medical): No   Lack of Transportation (Non-Medical): No  Physical Activity: Unknown   Days of Exercise per Week: 7 days   Minutes of Exercise per Session: Not on file  Stress: No Stress Concern Present   Feeling of Stress : Not at all  Social Connections: Unknown   Frequency of Communication with Friends and Family: Not on file   Frequency of Social Gatherings with Friends and Family: Not on file   Attends Religious Services: Not on Electrical engineer or Organizations: Not on file   Attends Archivist Meetings: Not on file   Marital Status: Married    Tobacco Counseling Counseling given: Not Answered   Clinical Intake:  Pre-visit preparation completed: Yes        Diabetes: No  How often do you need to have someone help you when you read instructions, pamphlets, or other written materials from your doctor or pharmacy?: 1 - Never    Interpreter Needed?: No      Activities of Daily Living In your present state of health, do you have any difficulty performing the following activities: 06/21/2020  Hearing? N  Vision? N  Difficulty concentrating or making decisions? N  Walking or climbing stairs? N   Dressing or bathing? N  Doing errands, shopping? N  Preparing Food and eating ? N  Using the Toilet? N  In the past six months, have you accidently leaked urine? N  Comment Followed by Urology and Pcp  Do you have problems with loss of bowel control? N  Managing your Medications? N  Managing your Finances? N  Housekeeping or managing your Housekeeping? N  Some recent data might be hidden    Patient Care Team: McLean-Scocuzza, Nino Glow, MD as PCP - General (Internal Medicine) Christene Lye, MD (General Surgery) Arnetha Courser, MD as Attending Physician (Family Medicine)  Indicate any recent Medical Services you may have received from other than [facility] providers in the past year (date may be approximate).     Assessment:   This is a routine wellness examination for Taylor.  I connected with Georgie today by telephone and verified that I am speaking with the correct person using two identifiers. Location patient: home Location provider: work Persons participating in the virtual visit: patient, Marine scientist.    I discussed the limitations, risks, security and privacy concerns of performing an evaluation and management service by telephone and the availability of in person appointments. The patient expressed understanding and verbally consented to this telephonic visit.    Interactive audio and video telecommunications were attempted between this provider and patient, however failed, due to patient having technical difficulties OR patient did not have access to video capability.  We continued and completed visit with audio only.  Some vital signs may be absent or patient reported.   Hearing/Vision screen Hearing Screening - Comments:: Patient is able to hear conversational tones without difficulty.  No issues reported. Vision Screening - Comments:: Followed by [facility] Wears corrective lenses They have seen their ophthalmologist in the last 12 months.    Dietary issues  and exercise activities discussed: Current Exercise Habits: Home exercise routine, Type of exercise: walking;strength training/weights;calisthenics, Intensity: Moderate  Healthy diet Good water intake   Goals Addressed               This Visit's Progress     Patient Stated     Increase physical activity (pt-stated)         Depression Screen PHQ 2/9 Scores 06/21/2020 07/21/2019 06/02/2019 01/03/2019 01/03/2018 06/18/2017 10/29/2016  PHQ - 2 Score 0 0 0 0 0 0 0  PHQ- 9 Score - - - - 0 - -    Fall Risk Fall Risk  06/21/2020 03/22/2020 07/21/2019 06/02/2019 01/03/2019  Falls in the past year? 0 0 0 0 0  Number falls in past yr: 0 0 0 0 -  Injury with Fall? 0 0 0 - -  Follow up Falls evaluation completed Falls evaluation completed Falls evaluation completed Falls evaluation completed -   ASSISTIVE DEVICES UTILIZED TO PREVENT FALLS:  Life alert? No  Use of a cane, walker or w/c? No   TIMED UP AND GO:  Was the test performed? No . Virtual visit.   Cognitive Function: Patient is alert and oriented x3.  Denies difficulty focusing, making decisions, memory loss.  MMSE/6CIT deferred. Normal by direct communication/observation.  MMSE - Mini Mental State Exam 06/02/2019  Not completed: Unable to complete     6CIT Screen 06/18/2017  What Year? 0 points  What month? 0 points  What time? 0 points  Count back from 20 0 points  Months in reverse 0 points  Repeat phrase 2 points  Total Score 2    Immunizations Immunization History  Administered Date(s) Administered   Influenza,inj,Quad PF,6+ Mos 10/13/2018   Influenza-Unspecified 09/21/2017   Moderna Sars-Covid-2 Vaccination 01/12/2019, 02/10/2019, 10/05/2019   Pneumococcal Conjugate-13 07/21/2019   Pneumococcal Polysaccharide-23 12/03/2017   Zoster Recombinat (Shingrix) 07/11/2017, 09/25/2017   Zoster, Live 08/23/2014    Health  Maintenance There are no preventive care reminders to display for this patient. Health Maintenance   Topic Date Due   TETANUS/TDAP  08/03/2020   INFLUENZA VACCINE  08/12/2020   COVID-19 Vaccine (5 - Booster for Moderna series) 08/12/2020   COLONOSCOPY (Pts 45-68yrs Insurance coverage will need to be confirmed)  09/07/2022   Hepatitis C Screening  Completed   PNA vac Low Risk Adult  Completed   Zoster Vaccines- Shingrix  Completed   HPV VACCINES  Aged Out    Colorectal cancer screening: Type of screening: Colonoscopy. Completed 09/07/19. Repeat every 3 years  Lung Cancer Screening: (Low Dose CT Chest recommended if Age 52-80 years, 30 pack-year currently smoking OR have quit w/in 15years.) does not qualify.   Vision Screening: Recommended annual ophthalmology exams for early detection of glaucoma and other disorders of the eye. Is the patient up to date with their annual eye exam?  Yes   Dental Screening: Recommended annual dental exams for proper oral hygiene. Visits every 6 months.  Community Resource Referral / Chronic Care Management: CRR required this visit?  No   CCM required this visit?  No      Plan:   Keep all routine maintenance appointments.   I have personally reviewed and noted the following in the patient's chart:   Medical and social history Use of alcohol, tobacco or illicit drugs  Current medications and supplements including opioid prescriptions. Patient is not currently taking opioid prescriptions. Functional ability and status Nutritional status Physical activity Advanced directives List of other physicians Hospitalizations, surgeries, and ER visits in previous 12 months Vitals Screenings to include cognitive, depression, and falls Referrals and appointments  In addition, I have reviewed and discussed with patient certain preventive protocols, quality metrics, and best practice recommendations. A written personalized care plan for preventive services as well as general preventive health recommendations were provided to patient via mychart.      Varney Biles, LPN   03/19/6576

## 2020-06-21 NOTE — Telephone Encounter (Signed)
Pt called into the office at 7:55am on 06/21/20 to inquire about a virtual appointment. Provided AccessNurse Documentation

## 2020-11-07 ENCOUNTER — Other Ambulatory Visit: Payer: Self-pay | Admitting: Internal Medicine

## 2020-11-07 DIAGNOSIS — G4700 Insomnia, unspecified: Secondary | ICD-10-CM

## 2020-11-08 NOTE — Telephone Encounter (Signed)
RX Refill: Lorrin Mais Last Seen: 03-22-20 Last Ordered: 03-22-20 Next Appt: 11-22-20

## 2020-11-22 ENCOUNTER — Encounter: Payer: Self-pay | Admitting: Internal Medicine

## 2020-11-22 ENCOUNTER — Other Ambulatory Visit: Payer: Self-pay

## 2020-11-22 ENCOUNTER — Ambulatory Visit (INDEPENDENT_AMBULATORY_CARE_PROVIDER_SITE_OTHER): Payer: Medicare (Managed Care) | Admitting: Internal Medicine

## 2020-11-22 VITALS — BP 122/86 | HR 75 | Temp 97.3°F | Ht 75.9 in | Wt 201.2 lb

## 2020-11-22 DIAGNOSIS — Z23 Encounter for immunization: Secondary | ICD-10-CM

## 2020-11-22 DIAGNOSIS — Z1329 Encounter for screening for other suspected endocrine disorder: Secondary | ICD-10-CM

## 2020-11-22 DIAGNOSIS — Z13220 Encounter for screening for lipoid disorders: Secondary | ICD-10-CM | POA: Diagnosis not present

## 2020-11-22 DIAGNOSIS — Z1389 Encounter for screening for other disorder: Secondary | ICD-10-CM

## 2020-11-22 DIAGNOSIS — Z0000 Encounter for general adult medical examination without abnormal findings: Secondary | ICD-10-CM

## 2020-11-22 DIAGNOSIS — K439 Ventral hernia without obstruction or gangrene: Secondary | ICD-10-CM

## 2020-11-22 DIAGNOSIS — E559 Vitamin D deficiency, unspecified: Secondary | ICD-10-CM | POA: Diagnosis not present

## 2020-11-22 DIAGNOSIS — K219 Gastro-esophageal reflux disease without esophagitis: Secondary | ICD-10-CM

## 2020-11-22 MED ORDER — PANTOPRAZOLE SODIUM 40 MG PO TBEC: 40.0000 mg | DELAYED_RELEASE_TABLET | Freq: Every day | ORAL | 3 refills | Status: DC

## 2020-11-22 MED ORDER — TETANUS-DIPHTH-ACELL PERTUSSIS 5-2.5-18.5 LF-MCG/0.5 IM SUSP: 0.5000 mL | Freq: Once | INTRAMUSCULAR | 0 refills | Status: AC

## 2020-11-22 NOTE — Patient Instructions (Addendum)
Therefore I recommended continued PSA monitoring every 6 months with Dr Rosana Hoes and to undergo PSMA PET imaging when his PSA reaches the 0.3-0.5 range. I would be happy to see him back at that point to discuss the findings and next steps. He was pleased with this plan and will follow with Dr Rosana Hoes for this work up.     Dr. Johnathan Hausen  Phone Fax E-mail Address  [phone] Not available [place]   [place] [id number]     Specialties       Dr. Sharol Given Perex  [address] [place] [address]  [phone]

## 2020-11-22 NOTE — Progress Notes (Signed)
Chief Complaint  Patient presents with   Follow-up   F/u  1. Left ant abdomen hernia at times sees bulge saw Dr.Byrnett who did hernia surgery and did not suggest another surgery unless painful when working out wears belt at times burns inside  Disc Dr.Matt Martin/Dr. Sharen Counter    Review of Systems  Constitutional:  Negative for weight loss.  HENT:  Negative for hearing loss.   Eyes:  Negative for blurred vision.  Respiratory:  Negative for shortness of breath.   Cardiovascular:  Negative for chest pain.  Gastrointestinal:  Negative for abdominal pain and blood in stool.  Musculoskeletal:  Negative for back pain.  Skin:  Negative for rash.  Neurological:  Negative for headaches.  Psychiatric/Behavioral:  Negative for depression.   Past Medical History:  Diagnosis Date   Adult ADHD    Dr. Rosine Door   Allergy    Cancer [facility]) 2012   PROSTATE   Family hx of colon cancer 05/08/2016   GERD (gastroesophageal reflux disease)    History of implantation of artificial sphincter    Hx of malignant neoplasm of prostate 05/08/2016   2012, s/p radical prostatectomy, urologist is Dr. Tresa Endo [facility]   Hx of malignant neoplasm of prostate 05/08/2016   2012, s/p radical prostatectomy, urologist is Dr. Tresa Endo [facility]   Insomnia    Proteinuria 03/11/2018   Evaluated by nephrologist Feb 2020; may be interstitial, related to pantoprazole use   Sleep apnea    CPAP   Varicose vein of leg    removed veins in 1972   Past Surgical History:  Procedure Laterality Date   artificaill sphincter  12/2015   COLONOSCOPY  2016   HERNIA REPAIR  1610   umbilical    PENILE PROSTHESIS IMPLANT     PROSTATECTOMY  2012   Radical Prostatectomy.   UMBILICAL HERNIA REPAIR N/A 12/11/2016   Procedure: HERNIA REPAIR UMBILICAL ADULT;  Surgeon: Christene Lye, MD;  Location: [facility] ORS;  Service: General;  Laterality: N/A;   VARICOSE VEIN SURGERY Left 1972   Family History  Problem Relation Age of Onset    Cancer Sister        adrenal gland cancer   Cancer Brother        Prostate CA   Pneumonia Mother    Dementia Father    Cancer Brother        lung (smoker)   Pneumonia Brother        CAP   Cancer Sister        colon   Diabetes Brother    Irritable bowel syndrome Brother    Cancer Brother        prostate - in remission   Social History   Socioeconomic History   Marital status: Married    Spouse name: Not on file   Number of children: Not on file   Years of education: Not on file   Highest education level: Not on file  Occupational History   Not on file  Tobacco Use   Smoking status: Never   Smokeless tobacco: Never  Vaping Use   Vaping Use: Never used  Substance and Sexual Activity   Alcohol use: Yes    Alcohol/week: 2.0 standard drinks    Types: 2 Glasses of wine per week    Comment: WINE OCC   Drug use: No   Sexual activity: Yes    Partners: Female  Other Topics Concern   Not on file  Social History Narrative  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    Daughter and son    daughter lives in [place] and has kids    Son is pharm D   Wife is Pharmacist, community    Social Determinants of Radio broadcast assistant Strain: Low Risk    Difficulty of Paying Living Expenses: Not hard at all  Food Insecurity: No Food Insecurity   Worried About Charity fundraiser in the Last Year: Never true   Arboriculturist in the Last Year: Never true  Transportation Needs: No Transportation Needs   Lack of Transportation (Medical): No   Lack of Transportation (Non-Medical): No  Physical Activity: Unknown   Days of Exercise per Week: 7 days   Minutes of Exercise per Session: Not on file  Stress: No Stress Concern Present   Feeling of Stress : Not at all  Social Connections: Unknown   Frequency of Communication with Friends and Family: Not on file   Frequency of Social Gatherings with Friends and Family: Not on file   Attends Religious Services: Not on Software engineer or Organizations: Not on file   Attends Archivist Meetings: Not on file   Marital Status: Married  Human resources officer Violence: Not At Risk   Fear of Current or Ex-Partner: No   Emotionally Abused: No   Physically Abused: No   Sexually Abused: No   Current Meds  Medication Sig   Humidifier MISC    Melatonin 3 MG TABS Take 3 mg by mouth at bedtime. Reported on 03/21/2015   Multiple Vitamins-Minerals (MULTIVITAMIN WITH MINERALS) tablet Take 1 tablet daily by mouth.    OMEGA-3 FATTY ACIDS PO Take by mouth.   OVER THE COUNTER MEDICATION Take 1 tablet by mouth daily as needed. Privigin - for memory   Tdap (BOOSTRIX) 5-2.5-18.5 LF-MCG/0.5 injection Inject 0.5 mLs into the muscle once for 1 dose.   Turmeric 500 MG CAPS Take 500 mg by mouth daily.   zolpidem (AMBIEN) 10 MG tablet TAKE 1 TABLET BY MOUTH AT BEDTIME AS NEEDED FOR SLEEP.   [DISCONTINUED] pantoprazole (PROTONIX) 40 MG tablet Take 1 tablet (40 mg total) by mouth daily. 30 minutes   Allergies  Allergen Reactions   Chocolate Flavor Other (See Comments)    Cysts under the skin.   Tetracyclines & Related Hives and Rash   No results found for this or any previous visit (from the past 2160 hour(s)). Objective  Body mass index is 24.56 kg/m. Wt Readings from Last 3 Encounters:  11/22/20 201 lb 3.2 oz (91.3 kg)  06/21/20 191 lb (86.6 kg)  03/22/20 191 lb 12.8 oz (87 kg)   Temp Readings from Last 3 Encounters:  11/22/20 (!) 97.3 F (36.3 C) (Temporal)  03/22/20 98.4 F (36.9 C) (Oral)  07/21/19 98.2 F (36.8 C) (Oral)   BP Readings from Last 3 Encounters:  11/22/20 122/86  03/22/20 112/84  07/21/19 126/82   Pulse Readings from Last 3 Encounters:  11/22/20 75  03/22/20 92  07/21/19 70    Physical Exam Vitals and nursing note reviewed.  Constitutional:      Appearance: Normal appearance. He is well-developed and well-groomed. He is obese.  HENT:     Head: Normocephalic and atraumatic.  Eyes:      Conjunctiva/sclera: Conjunctivae normal.     Pupils: Pupils are equal, round, and reactive to light.  Cardiovascular:     Rate and Rhythm: Normal rate and regular  rhythm.     Heart sounds: Normal heart sounds.  Pulmonary:     Effort: Pulmonary effort is normal. No respiratory distress.     Breath sounds: Normal breath sounds.  Abdominal:     Tenderness: There is no abdominal tenderness.  Musculoskeletal:     Lumbar back: Tenderness present. Negative right straight leg raise test and negative left straight leg raise test.  Skin:    General: Skin is warm and moist.  Neurological:     General: No focal deficit present.     Mental Status: He is alert and oriented to person, place, and time. Mental status is at baseline.     Sensory: Sensation is intact.     Motor: Motor function is intact.     Coordination: Coordination is intact.     Gait: Gait is intact. Gait normal.  Psychiatric:        Attention and Perception: Attention and perception normal.        Mood and Affect: Mood and affect normal.        Speech: Speech normal.        Behavior: Behavior normal. Behavior is cooperative.        Thought Content: Thought content normal.        Cognition and Memory: Cognition and memory normal.        Judgment: Judgment normal.    Assessment  Plan  Hernia of abdominal wall Consider consult Dr. Ardelle Lesches  Gastroesophageal reflux disease - Plan: pantoprazole (PROTONIX) 40 MG tablet   HM  Sch fasting labs  Flu shot utd  pna 23 utd  prevar utd shingrix 2/2 utd  tdap given Rx today covid 5/5   H/o Prostate cancer s/p radical prostectomy getting PSA [facility] urology Dr. Lawerance Bach saw 12/2018 due to f/u in 3-6 months PSA 0.12 and 0.14 as of 01/02/19, 04/07/19 0.15 may need radiation in the future appt had 07/24/19 PSA done again repeat in 6 months  02/08/20 0.17  -referred Dr. Lawanna Kobus [facility] 03/22/20 Hx of malignant neoplasm of prostate  F/u [facility] urology upcoming PSA  trending up as of 04/07/19 0.15 and may need radiation again s/p h/o prostate cancer    hep C 07/01/18 Colonoscopy 05/2016 [facility] Dr. Alain Marion polyps, gastric polyps, mild colitis he f/u with Dr. Alain Marion EGD had 11/2017 Dr. Alain Marion  -07/2019 refer [facility] had 8/26/21adenomatous and hyperplastic polyp GI bx negative f/u EGD 3 years, colonoscopy 5 years.    rec healthy diet and exercise    Provider: Dr. Olivia Mackie McLean-Scocuzza-Internal Medicine

## 2021-03-14 ENCOUNTER — Ambulatory Visit (INDEPENDENT_AMBULATORY_CARE_PROVIDER_SITE_OTHER): Payer: 59 | Admitting: Internal Medicine

## 2021-03-14 ENCOUNTER — Encounter: Payer: Self-pay | Admitting: Internal Medicine

## 2021-03-14 ENCOUNTER — Other Ambulatory Visit: Payer: Self-pay

## 2021-03-14 VITALS — BP 124/76 | HR 80 | Temp 97.9°F | Ht 73.74 in | Wt 195.0 lb

## 2021-03-14 DIAGNOSIS — Z1329 Encounter for screening for other suspected endocrine disorder: Secondary | ICD-10-CM

## 2021-03-14 DIAGNOSIS — Z0000 Encounter for general adult medical examination without abnormal findings: Secondary | ICD-10-CM

## 2021-03-14 DIAGNOSIS — M67912 Unspecified disorder of synovium and tendon, left shoulder: Secondary | ICD-10-CM | POA: Diagnosis not present

## 2021-03-14 DIAGNOSIS — K219 Gastro-esophageal reflux disease without esophagitis: Secondary | ICD-10-CM

## 2021-03-14 DIAGNOSIS — G4700 Insomnia, unspecified: Secondary | ICD-10-CM

## 2021-03-14 DIAGNOSIS — Z13220 Encounter for screening for lipoid disorders: Secondary | ICD-10-CM

## 2021-03-14 DIAGNOSIS — Z23 Encounter for immunization: Secondary | ICD-10-CM

## 2021-03-14 DIAGNOSIS — M19012 Primary osteoarthritis, left shoulder: Secondary | ICD-10-CM | POA: Diagnosis not present

## 2021-03-14 DIAGNOSIS — R739 Hyperglycemia, unspecified: Secondary | ICD-10-CM

## 2021-03-14 DIAGNOSIS — E559 Vitamin D deficiency, unspecified: Secondary | ICD-10-CM

## 2021-03-14 DIAGNOSIS — Z1389 Encounter for screening for other disorder: Secondary | ICD-10-CM

## 2021-03-14 MED ORDER — PANTOPRAZOLE SODIUM 40 MG PO TBEC: 40.0000 mg | DELAYED_RELEASE_TABLET | Freq: Every day | ORAL | 3 refills | Status: DC

## 2021-03-14 MED ORDER — ZOLPIDEM TARTRATE 10 MG PO TABS: 10.0000 mg | ORAL_TABLET | Freq: Every evening | ORAL | 5 refills | Status: DC | PRN

## 2021-03-14 MED ORDER — TETANUS-DIPHTH-ACELL PERTUSSIS 5-2.5-18.5 LF-MCG/0.5 IM SUSP: 0.5000 mL | Freq: Once | INTRAMUSCULAR | 0 refills | Status: AC

## 2021-03-14 NOTE — Progress Notes (Signed)
Chief Complaint  Patient presents with   Annual Exam   Annual  1. Doing well needed for insurance  2. H/o prostate cancer PSA 12/12/20 0.24 up from 02/08/20 0.17 if goes up to 1 will do radiation monitoring Q 6 months urology at [facility] seen urology at [facility] in the past    Review of Systems  Constitutional:  Negative for weight loss.  HENT:  Negative for hearing loss.   Eyes:  Negative for blurred vision.  Respiratory:  Negative for shortness of breath.   Cardiovascular:  Negative for chest pain.  Gastrointestinal:  Negative for abdominal pain and blood in stool.  Musculoskeletal:  Positive for joint pain. Negative for back pain.       +left shoulder pain  Skin:  Negative for rash.  Neurological:  Negative for headaches.  Psychiatric/Behavioral:  Negative for depression.   Past Medical History:  Diagnosis Date   Adult ADHD    Dr. Rosine Door   Allergy    Cancer [facility]) 2012   PROSTATE   Family hx of colon cancer 05/08/2016   GERD (gastroesophageal reflux disease)    History of implantation of artificial sphincter    Hx of malignant neoplasm of prostate 05/08/2016   2012, s/p radical prostatectomy, urologist is Dr. Tresa Endo [facility]   Hx of malignant neoplasm of prostate 05/08/2016   2012, s/p radical prostatectomy, urologist is Dr. Tresa Endo [facility]   Insomnia    Proteinuria 03/11/2018   Evaluated by nephrologist Feb 2020; may be interstitial, related to pantoprazole use   Sleep apnea    CPAP   Varicose vein of leg    removed veins in 1972   Past Surgical History:  Procedure Laterality Date   artificaill sphincter  12/2015   COLONOSCOPY  2016   HERNIA REPAIR  4098   umbilical    PENILE PROSTHESIS IMPLANT     PROSTATECTOMY  2012   Radical Prostatectomy.   UMBILICAL HERNIA REPAIR N/A 12/11/2016   Procedure: HERNIA REPAIR UMBILICAL ADULT;  Surgeon: Christene Lye, MD;  Location: [facility] ORS;  Service: General;  Laterality: N/A;   VARICOSE VEIN SURGERY Left 1972   Family  History  Problem Relation Age of Onset   Cancer Sister        adrenal gland cancer   Cancer Brother        Prostate CA   Pneumonia Mother    Dementia Father    Cancer Brother        lung (smoker)   Pneumonia Brother        CAP   Cancer Sister        colon   Diabetes Brother    Irritable bowel syndrome Brother    Cancer Brother        prostate - in remission   Social History   Socioeconomic History   Marital status: Married    Spouse name: Not on file   Number of children: Not on file   Years of education: Not on file   Highest education level: Not on file  Occupational History   Not on file  Tobacco Use   Smoking status: Never   Smokeless tobacco: Never  Vaping Use   Vaping Use: Never used  Substance and Sexual Activity   Alcohol use: Yes    Alcohol/week: 2.0 standard drinks    Types: 2 Glasses of wine per week    Comment: WINE OCC   Drug use: No   Sexual activity: Yes  Partners: Female  Other Topics Concern   Not on file  Social History Narrative   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    Daughter and son    daughter lives in [place] and has kids    Son is pharm D   Wife is Pharmacist, community    Social Determinants of Radio broadcast assistant Strain: Low Risk    Difficulty of Paying Living Expenses: Not hard at all  Food Insecurity: No Food Insecurity   Worried About Charity fundraiser in the Last Year: Never true   Arboriculturist in the Last Year: Never true  Transportation Needs: No Transportation Needs   Lack of Transportation (Medical): No   Lack of Transportation (Non-Medical): No  Physical Activity: Unknown   Days of Exercise per Week: 7 days   Minutes of Exercise per Session: Not on file  Stress: No Stress Concern Present   Feeling of Stress : Not at all  Social Connections: Unknown   Frequency of Communication with Friends and Family: Not on file   Frequency of Social Gatherings with Friends and Family: Not on file   Attends Religious  Services: Not on Electrical engineer or Organizations: Not on file   Attends Archivist Meetings: Not on file   Marital Status: Married  Human resources officer Violence: Not At Risk   Fear of Current or Ex-Partner: No   Emotionally Abused: No   Physically Abused: No   Sexually Abused: No   Current Meds  Medication Sig   Humidifier MISC    Melatonin 3 MG TABS Take 3 mg by mouth at bedtime. Reported on 03/21/2015   MELOXICAM PO Take by mouth as needed.   Multiple Vitamins-Minerals (MULTIVITAMIN WITH MINERALS) tablet Take 1 tablet daily by mouth.    OMEGA-3 FATTY ACIDS PO Take by mouth.   OVER THE COUNTER MEDICATION Take 1 tablet by mouth daily as needed. Privigin - for memory   Tdap (BOOSTRIX) 5-2.5-18.5 LF-MCG/0.5 injection Inject 0.5 mLs into the muscle once for 1 dose.   Triamcinolone Acetonide (NASACORT ALLERGY 24HR NA) Place into the nose as needed.   Turmeric 500 MG CAPS Take 500 mg by mouth daily.   [DISCONTINUED] pantoprazole (PROTONIX) 40 MG tablet Take 1 tablet (40 mg total) by mouth daily. 30 minutes   [DISCONTINUED] zolpidem (AMBIEN) 10 MG tablet TAKE 1 TABLET BY MOUTH AT BEDTIME AS NEEDED FOR SLEEP.   Allergies  Allergen Reactions   Chocolate Flavor Other (See Comments)    Cysts under the skin.   Tetracyclines & Related Hives and Rash   No results found for this or any previous visit (from the past 2160 hour(s)). Objective  Body mass index is 25.21 kg/m. Wt Readings from Last 3 Encounters:  03/14/21 195 lb (88.5 kg)  11/22/20 201 lb 3.2 oz (91.3 kg)  06/21/20 191 lb (86.6 kg)   Temp Readings from Last 3 Encounters:  03/14/21 97.9 F (36.6 C) (Oral)  11/22/20 (!) 97.3 F (36.3 C) (Temporal)  03/22/20 98.4 F (36.9 C) (Oral)   BP Readings from Last 3 Encounters:  03/14/21 124/76  11/22/20 122/86  03/22/20 112/84   Pulse Readings from Last 3 Encounters:  03/14/21 80  11/22/20 75  03/22/20 92    Physical Exam Vitals and nursing note  reviewed.  Constitutional:      Appearance: Normal appearance. He is well-developed and well-groomed.  HENT:     Head: Normocephalic and  atraumatic.  Eyes:     Conjunctiva/sclera: Conjunctivae normal.     Pupils: Pupils are equal, round, and reactive to light.  Cardiovascular:     Rate and Rhythm: Normal rate and regular rhythm.     Heart sounds: Normal heart sounds.  Pulmonary:     Effort: Pulmonary effort is normal. No respiratory distress.     Breath sounds: Normal breath sounds.  Abdominal:     Tenderness: There is no abdominal tenderness.  Skin:    General: Skin is warm and moist.  Neurological:     General: No focal deficit present.     Mental Status: He is alert and oriented to person, place, and time. Mental status is at baseline.     Sensory: Sensation is intact.     Motor: Motor function is intact.     Coordination: Coordination is intact.     Gait: Gait is intact. Gait normal.  Psychiatric:        Attention and Perception: Attention and perception normal.        Mood and Affect: Mood and affect normal.        Speech: Speech normal.        Behavior: Behavior normal. Behavior is cooperative.        Thought Content: Thought content normal.        Cognition and Memory: Cognition and memory normal.        Judgment: Judgment normal.    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 Lawerance Bach saw 12/2018 due to f/u in 3-6 months PSA 0.12 and 0.14 as of 01/02/19, 04/07/19 0.15 may need radiation in the future appt had 07/24/19 PSA done again repeat in 6 months  02/08/20 0.17  -referred Dr. Lawanna Kobus [facility] 03/22/20 Hx of malignant neoplasm of prostate  F/u [facility] urology upcoming PSA trending up as of 04/07/19 0.15 and may need radiation again s/p h/o prostate cancer    hep C 07/01/18 Colonoscopy 05/2016 [facility] Dr. Alain Marion polyps, gastric polyps, mild colitis he f/u with Dr. Alain Marion EGD had 11/2017 Dr. Alain Marion  -07/2019 [facility] had 8/26/21adenomatous and hyperplastic polyp GI bx negative f/u EGD 3 years, colonoscopy 5 years.    rec healthy diet and exercis  Provider: Dr. Olivia Mackie McLean-Scocuzza-Internal Medicine

## 2021-03-14 NOTE — Patient Instructions (Addendum)
Let me know if you want to do physical therapy  ? ?[facility]  ?Or  ?Dr. Johnathan Hausen  ? ?

## 2021-03-20 ENCOUNTER — Encounter: Payer: Self-pay | Admitting: Internal Medicine

## 2021-04-29 ENCOUNTER — Encounter: Payer: Self-pay | Admitting: Internal Medicine

## 2021-04-29 DIAGNOSIS — R7303 Prediabetes: Secondary | ICD-10-CM | POA: Insufficient documentation

## 2021-04-29 LAB — COMPREHENSIVE METABOLIC PANEL
ALT: 29 IU/L (ref 0–44)
AST: 29 IU/L (ref 0–40)
Albumin/Globulin Ratio: 1.3 (ref 1.2–2.2)
Albumin: 4.5 g/dL (ref 3.8–4.8)
Alkaline Phosphatase: 80 IU/L (ref 44–121)
BUN/Creatinine Ratio: 11 (ref 10–24)
BUN: 10 mg/dL (ref 8–27)
Bilirubin Total: 0.5 mg/dL (ref 0.0–1.2)
CO2: 22 mmol/L (ref 20–29)
Calcium: 9.6 mg/dL (ref 8.6–10.2)
Chloride: 104 mmol/L (ref 96–106)
Creatinine, Ser: 0.92 mg/dL (ref 0.76–1.27)
Globulin, Total: 3.5 g/dL (ref 1.5–4.5)
Glucose: 93 mg/dL (ref 70–99)
Potassium: 4.5 mmol/L (ref 3.5–5.2)
Sodium: 141 mmol/L (ref 134–144)
Total Protein: 8 g/dL (ref 6.0–8.5)
eGFR: 90 mL/min/{1.73_m2} (ref >59)

## 2021-04-29 LAB — MICROSCOPIC EXAMINATION
Bacteria, UA: NONE SEEN
Casts: NONE SEEN /lpf
Epithelial Cells (non renal): NONE SEEN /hpf (ref 0–10)
RBC, Urine: NONE SEEN /hpf (ref 0–2)
WBC, UA: NONE SEEN /hpf (ref 0–5)

## 2021-04-29 LAB — CBC WITH DIFFERENTIAL/PLATELET
Basophils Absolute: 0 10*3/uL (ref 0.0–0.2)
Basos: 1 %
EOS (ABSOLUTE): 0.1 10*3/uL (ref 0.0–0.4)
Eos: 2 %
Hematocrit: 44.5 % (ref 37.5–51.0)
Hemoglobin: 14.4 g/dL (ref 13.0–17.7)
Immature Grans (Abs): 0 10*3/uL (ref 0.0–0.1)
Immature Granulocytes: 0 %
Lymphocytes Absolute: 1.6 10*3/uL (ref 0.7–3.1)
Lymphs: 31 %
MCH: 28.3 pg (ref 26.6–33.0)
MCHC: 32.4 g/dL (ref 31.5–35.7)
MCV: 88 fL (ref 79–97)
Monocytes Absolute: 0.5 10*3/uL (ref 0.1–0.9)
Monocytes: 9 %
Neutrophils Absolute: 3.1 10*3/uL (ref 1.4–7.0)
Neutrophils: 57 %
Platelets: 181 10*3/uL (ref 150–450)
RBC: 5.08 x10E6/uL (ref 4.14–5.80)
RDW: 15.1 % (ref 11.6–15.4)
WBC: 5.4 10*3/uL (ref 3.4–10.8)

## 2021-04-29 LAB — URINALYSIS, ROUTINE W REFLEX MICROSCOPIC
Bilirubin, UA: NEGATIVE
Glucose, UA: NEGATIVE
Ketones, UA: NEGATIVE
Leukocytes,UA: NEGATIVE
Nitrite, UA: NEGATIVE
RBC, UA: NEGATIVE
Specific Gravity, UA: 1.026 (ref 1.005–1.030)
Urobilinogen, Ur: 0.2 mg/dL (ref 0.2–1.0)
pH, UA: 5.5 (ref 5.0–7.5)

## 2021-04-29 LAB — TSH: TSH: 2.56 u[IU]/mL (ref 0.450–4.500)

## 2021-04-29 LAB — LIPID PANEL
Chol/HDL Ratio: 3.2 ratio (ref 0.0–5.0)
Cholesterol, Total: 173 mg/dL (ref 100–199)
HDL: 54 mg/dL (ref >39)
LDL Chol Calc (NIH): 101 mg/dL — ABNORMAL HIGH (ref 0–99)
Triglycerides: 100 mg/dL (ref 0–149)
VLDL Cholesterol Cal: 18 mg/dL (ref 5–40)

## 2021-04-29 LAB — HEMOGLOBIN A1C
Est. average glucose Bld gHb Est-mCnc: 120 mg/dL
Hgb A1c MFr Bld: 5.8 % — ABNORMAL HIGH (ref 4.8–5.6)

## 2021-04-29 LAB — VITAMIN D 25 HYDROXY (VIT D DEFICIENCY, FRACTURES): Vit D, 25-Hydroxy: 62.5 ng/mL (ref 30.0–100.0)

## 2021-06-20 ENCOUNTER — Telehealth: Payer: Self-pay | Admitting: Internal Medicine

## 2021-06-20 NOTE — Telephone Encounter (Signed)
Spoke with patient he req CB due to starting new job

## 2021-07-07 ENCOUNTER — Emergency Department
Admission: EM | Admit: 2021-07-07 | Discharge: 2021-07-07 | Disposition: A | Discharge status: Home / Self Care | Payer: Medicare (Managed Care) | Attending: Emergency Medicine | Admitting: Emergency Medicine

## 2021-07-07 ENCOUNTER — Emergency Department: Payer: Medicare (Managed Care)

## 2021-07-07 ENCOUNTER — Other Ambulatory Visit: Payer: Self-pay

## 2021-07-07 ENCOUNTER — Encounter: Payer: Self-pay | Admitting: Emergency Medicine

## 2021-07-07 DIAGNOSIS — Y92410 Unspecified street and highway as the place of occurrence of the external cause: Secondary | ICD-10-CM | POA: Diagnosis not present

## 2021-07-07 DIAGNOSIS — M19012 Primary osteoarthritis, left shoulder: Secondary | ICD-10-CM | POA: Diagnosis not present

## 2021-07-07 DIAGNOSIS — M25512 Pain in left shoulder: Secondary | ICD-10-CM

## 2021-07-07 DIAGNOSIS — I10 Essential (primary) hypertension: Secondary | ICD-10-CM | POA: Insufficient documentation

## 2021-07-07 DIAGNOSIS — M542 Cervicalgia: Secondary | ICD-10-CM | POA: Insufficient documentation

## 2021-07-07 DIAGNOSIS — G8929 Other chronic pain: Secondary | ICD-10-CM | POA: Insufficient documentation

## 2021-07-07 DIAGNOSIS — Z8546 Personal history of malignant neoplasm of prostate: Secondary | ICD-10-CM | POA: Insufficient documentation

## 2021-07-07 MED ORDER — ACETAMINOPHEN 500 MG PO TABS
1000.0000 mg | ORAL_TABLET | Freq: Once | ORAL | Status: AC
  Administered 2021-07-07: 1000 mg via ORAL
  Filled 2021-07-07: qty 2

## 2021-07-07 MED ORDER — LIDOCAINE 5 % EX PTCH
1.0000 | MEDICATED_PATCH | CUTANEOUS | Status: DC
  Administered 2021-07-07: 1 via TRANSDERMAL
  Filled 2021-07-07: qty 1

## 2021-07-07 MED ORDER — IBUPROFEN 400 MG PO TABS
400.0000 mg | ORAL_TABLET | Freq: Once | ORAL | Status: AC
  Administered 2021-07-07: 400 mg via ORAL
  Filled 2021-07-07: qty 1
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
## 2021-07-09 ENCOUNTER — Ambulatory Visit (INDEPENDENT_AMBULATORY_CARE_PROVIDER_SITE_OTHER): Payer: Medicare (Managed Care) | Admitting: Internal Medicine

## 2021-07-09 ENCOUNTER — Encounter: Payer: Self-pay | Admitting: Internal Medicine

## 2021-07-09 DIAGNOSIS — R0683 Snoring: Secondary | ICD-10-CM

## 2021-07-09 DIAGNOSIS — M19012 Primary osteoarthritis, left shoulder: Secondary | ICD-10-CM | POA: Diagnosis not present

## 2021-07-09 DIAGNOSIS — G4700 Insomnia, unspecified: Secondary | ICD-10-CM

## 2021-07-09 DIAGNOSIS — M542 Cervicalgia: Secondary | ICD-10-CM | POA: Diagnosis not present

## 2021-07-09 DIAGNOSIS — Z8546 Personal history of malignant neoplasm of prostate: Secondary | ICD-10-CM

## 2021-07-09 DIAGNOSIS — M810 Age-related osteoporosis without current pathological fracture: Secondary | ICD-10-CM

## 2021-07-09 DIAGNOSIS — R972 Elevated prostate specific antigen [PSA]: Secondary | ICD-10-CM

## 2021-07-09 MED ORDER — TRAMADOL HCL 50 MG PO TABS: 50.0000 mg | ORAL_TABLET | Freq: Two times a day (BID) | ORAL | 0 refills | Status: DC | PRN

## 2021-07-09 MED ORDER — CYCLOBENZAPRINE HCL 5 MG PO TABS: 5.0000 mg | ORAL_TABLET | Freq: Every evening | ORAL | 2 refills | Status: DC | PRN

## 2021-07-09 MED ORDER — ZOLPIDEM TARTRATE 10 MG PO TABS: 10.0000 mg | ORAL_TABLET | Freq: Every evening | ORAL | 5 refills | Status: DC | PRN

## 2021-07-09 NOTE — Patient Instructions (Addendum)
[place] [facility] [place]    [facility] 4.7 15 Google reviews Physical therapist in [place], [place] Address: [address] [place], [place] [address]   Dr. Volanda Napoleon  I leave 10/24/21    Imagene Gurney, MD  (prostate oncology) [facility] [place],  [id number]   Phone: [phone]   Fax: [phone]

## 2021-07-09 NOTE — Addendum Note (Signed)
Addended by: Orland Mustard on: 07/09/2021 11:16 AM   Modules accepted: Orders
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
## 2021-07-17 NOTE — Addendum Note (Signed)
Addended by: Orland Mustard on: 07/17/2021 04:26 PM   Modules accepted: Orders

## 2021-07-29 ENCOUNTER — Other Ambulatory Visit: Payer: Self-pay | Admitting: Internal Medicine

## 2021-07-29 ENCOUNTER — Encounter: Payer: Self-pay | Admitting: Internal Medicine

## 2021-07-29 DIAGNOSIS — I10 Essential (primary) hypertension: Secondary | ICD-10-CM

## 2021-07-29 MED ORDER — AMLODIPINE BESYLATE 2.5 MG PO TABS: 2.5000 mg | ORAL_TABLET | Freq: Every day | ORAL | 3 refills | Status: DC

## 2021-08-06 ENCOUNTER — Encounter (INDEPENDENT_AMBULATORY_CARE_PROVIDER_SITE_OTHER): Payer: Self-pay

## 2021-08-20 ENCOUNTER — Institutional Professional Consult (permissible substitution): Payer: Medicare (Managed Care) | Admitting: Primary Care

## 2021-08-26 ENCOUNTER — Encounter: Payer: Self-pay | Admitting: Internal Medicine

## 2021-08-27 ENCOUNTER — Ambulatory Visit (INDEPENDENT_AMBULATORY_CARE_PROVIDER_SITE_OTHER): Payer: Medicare Other | Admitting: Family Medicine

## 2021-08-27 ENCOUNTER — Encounter: Payer: Self-pay | Admitting: Family Medicine

## 2021-08-27 VITALS — BP 128/82 | HR 71 | Temp 97.1°F | Ht 75.0 in | Wt 195.2 lb

## 2021-08-27 DIAGNOSIS — F5101 Primary insomnia: Secondary | ICD-10-CM | POA: Diagnosis not present

## 2021-08-27 DIAGNOSIS — E041 Nontoxic single thyroid nodule: Secondary | ICD-10-CM | POA: Diagnosis not present

## 2021-08-27 NOTE — Progress Notes (Addendum)
    SUBJECTIVE:   CHIEF COMPLAINT / HPI: check thyroid  Patient recently had PET scan for evaluation of recurrence of prostate cancer and was noted to have a 1 cm low attenuated thyroid nodule on PET.  Radiologist recommended follow up with ENT for u/s.  Patient reports that he spoke with Dr Leroy Libman and because an appointment was set up with PCP there had been no referral to ENT.   Would like to go ahead with ultrasound and referral today.  Denies any symptoms.  Endorses was at chiropractic therapy when pressure applied to left side of neck he noticed some pain.  Therapy was discontinued at that time  Insomnia Has been on ambien for years.  Working on CBT now to elp with sleep.  Discussed with patient decreasing Ambien and working on sleep hygiene.   HTN Asymptomatic.  Compliant with Norvasc.  No headaches, visual changes, chest pain, shortness of breath, or lower extremity swelling.  PERTINENT  PMH / PSH:  Recurrent prostate ca Insomnia   OBJECTIVE:   BP 128/82 (BP Location: Left Arm, Patient Position: Sitting, Cuff Size: Normal)   Pulse 71   Temp (!) 97.1 F (36.2 C) (Oral)   Ht '6\' 3"'$  (1.905 m)   Wt 195 lb 3.2 oz (88.5 kg)   SpO2 97%   BMI 24.40 kg/m    General: Alert, no acute distress Neck: no goiter, no palpable mass or cervical lymphadenopathy Cardio: Normal S1 and S2, RRR, no r/m/g Pulm: CTAB, normal work of breathing Abdomen: Bowel sounds normal. Abdomen soft and non-tender.  Extremities: No peripheral edema.   ASSESSMENT/PLAN:   Thyroid nodule Incidental finding on PET scan.  1 cm low attenuation left thyroid nodule.  Asymptomatic and benign exam today. -TSH, FT3, FT4 -u/s thyroid -Refer ENT for evaluation -Follow up as needed  Insomnia Discussed decreasing Ambien in future given increasing age and history of sleep apnea.  Patient agreeable to try.   -Plan to initiate decrease Ambien to 5 mg at next visit   PDMP reviewed  Carollee Leitz, MD

## 2021-08-27 NOTE — Patient Instructions (Signed)
It was a pleasure meeting you today. Thank you for allowing me to take part in your health care.  Our goals for today as we discussed include:  We will get some labs today.  If they are abnormal or we need to do something about them, I will call you.  If they are normal, I will send you a message on MyChart (if it is active) or a letter in the mail.  If you don't hear from [place] in 2 weeks, please call the office at the number below.    MyChart me with a ENT physician that he would like to be referred to.  Referral has been sent for ultrasound of thyroid.  They will call you with an appointment.  Please call MD if no appointment scheduled in the next 2 weeks.   Please follow-up with PCP in 2 months or sooner if needed  If you have any questions or concerns, please do not hesitate to call the office at [phone].  I look forward to our next visit and until then take care and stay safe.  Regards,   Carollee Leitz, MD   [facility]

## 2021-08-28 ENCOUNTER — Encounter: Payer: Self-pay | Admitting: Family Medicine

## 2021-08-28 LAB — TSH: TSH: 1.61 u[IU]/mL (ref 0.450–4.500)

## 2021-08-28 LAB — T3, FREE: T3, Free: 3.1 pg/mL (ref 2.0–4.4)

## 2021-08-28 LAB — T4, FREE: Free T4: 0.92 ng/dL (ref 0.82–1.77)

## 2021-08-28 NOTE — Telephone Encounter (Signed)
Can you please verify if patient would like the referral sent to Dr Pryor Ochoa or Dr Gregary Signs.    Thanks

## 2021-08-28 NOTE — Telephone Encounter (Signed)
Noted and sent referral

## 2021-08-31 ENCOUNTER — Encounter: Payer: Self-pay | Admitting: Family Medicine

## 2021-08-31 DIAGNOSIS — E041 Nontoxic single thyroid nodule: Secondary | ICD-10-CM | POA: Insufficient documentation

## 2021-08-31 NOTE — Assessment & Plan Note (Signed)
Discussed decreasing Ambien in future given increasing age and history of sleep apnea.  Patient agreeable to try.   -Plan to initiate decrease Ambien to 5 mg at next visit

## 2021-08-31 NOTE — Assessment & Plan Note (Signed)
Incidental finding on PET scan.  1 cm low attenuation left thyroid nodule.  Asymptomatic and benign exam today. -TSH, FT3, FT4 -u/s thyroid -Refer ENT for evaluation -Follow up as needed

## 2021-09-17 ENCOUNTER — Ambulatory Visit: Payer: Medicare Other

## 2021-09-18 ENCOUNTER — Ambulatory Visit
Admission: RE | Admit: 2021-09-18 | Discharge: 2021-09-18 | Disposition: A | Discharge status: Home / Self Care | Payer: 59 | Source: Ambulatory Visit | Attending: Family Medicine | Admitting: Family Medicine

## 2021-09-18 DIAGNOSIS — E041 Nontoxic single thyroid nodule: Secondary | ICD-10-CM | POA: Diagnosis present

## 2021-09-19 ENCOUNTER — Telehealth: Payer: Self-pay | Admitting: Internal Medicine

## 2021-09-19 NOTE — Telephone Encounter (Signed)
Spoke with patient he stated he is working full time and he will call us to sched AWV

## 2021-09-20 ENCOUNTER — Encounter: Payer: Self-pay | Admitting: Internal Medicine

## 2021-09-22 ENCOUNTER — Telehealth: Payer: Self-pay

## 2021-09-22 NOTE — Telephone Encounter (Signed)
LMTCB

## 2021-09-22 NOTE — Telephone Encounter (Signed)
Patient stated he has an appointment with Dr. Lula Olszewski at ENT

## 2021-09-22 NOTE — Telephone Encounter (Signed)
Pt returning call and message can be left on voice mail

## 2021-09-22 NOTE — Telephone Encounter (Signed)
Spoke with Patient about the thyroid nodule. Patient would like to call back and let us know which Endocrinology to send him to.

## 2021-09-22 NOTE — Telephone Encounter (Signed)
Please have him sign correct forms to have results sent.

## 2021-09-22 NOTE — Telephone Encounter (Signed)
-----   Message from Carollee Leitz, MD sent at 09/21/2021  4:46 PM EDT ----- There is a 1 cm nodule on left thyroid.  Should have annual thyroid ultrasound until stable.  Follow up with Endocrinology as planned.

## 2021-09-22 NOTE — Telephone Encounter (Signed)
Sorry, it's the ENT not the Endocrinology.  My apologies.

## 2021-10-01 ENCOUNTER — Telehealth: Payer: Self-pay

## 2021-10-01 NOTE — Telephone Encounter (Signed)
Spoke with pt in regards to his mychart msg about getting his [place] to [facility] he stated it was resolved that Dr. Lula Olszewski was able to see the results.

## 2021-10-06 ENCOUNTER — Other Ambulatory Visit: Payer: Self-pay | Admitting: Otolaryngology

## 2021-10-06 DIAGNOSIS — E041 Nontoxic single thyroid nodule: Secondary | ICD-10-CM

## 2021-10-09 ENCOUNTER — Ambulatory Visit: Payer: 59

## 2021-10-13 ENCOUNTER — Ambulatory Visit
Admission: RE | Admit: 2021-10-13 | Discharge: 2021-10-13 | Disposition: A | Discharge status: Home / Self Care | Payer: 59 | Source: Ambulatory Visit | Attending: Otolaryngology | Admitting: Otolaryngology

## 2021-10-13 DIAGNOSIS — E041 Nontoxic single thyroid nodule: Secondary | ICD-10-CM | POA: Diagnosis present

## 2021-10-15 ENCOUNTER — Ambulatory Visit: Payer: 59

## 2021-10-16 ENCOUNTER — Ambulatory Visit: Payer: 59

## 2021-10-28 ENCOUNTER — Encounter: Payer: Self-pay | Admitting: Otolaryngology

## 2021-10-28 LAB — CYTOLOGY - NON PAP

## 2021-11-20 ENCOUNTER — Other Ambulatory Visit: Payer: Medicare Other

## 2021-11-25 ENCOUNTER — Ambulatory Visit: Admit: 2021-11-25 | Payer: Medicare Other | Admitting: Otolaryngology

## 2021-11-25 SURGERY — THYROIDECTOMY
Anesthesia: General | Laterality: Bilateral

## 2022-04-28 ENCOUNTER — Encounter: Payer: 59 | Admitting: Internal Medicine

## 2022-05-06 ENCOUNTER — Encounter: Payer: Self-pay | Admitting: Family Medicine

## 2022-05-06 ENCOUNTER — Ambulatory Visit (INDEPENDENT_AMBULATORY_CARE_PROVIDER_SITE_OTHER): Payer: 59 | Admitting: Family Medicine

## 2022-05-06 VITALS — BP 130/78 | HR 70 | Temp 97.6°F | Ht 75.0 in | Wt 202.4 lb

## 2022-05-06 DIAGNOSIS — R739 Hyperglycemia, unspecified: Secondary | ICD-10-CM | POA: Diagnosis not present

## 2022-05-06 DIAGNOSIS — E890 Postprocedural hypothyroidism: Secondary | ICD-10-CM

## 2022-05-06 DIAGNOSIS — K219 Gastro-esophageal reflux disease without esophagitis: Secondary | ICD-10-CM | POA: Diagnosis not present

## 2022-05-06 DIAGNOSIS — I10 Essential (primary) hypertension: Secondary | ICD-10-CM | POA: Diagnosis not present

## 2022-05-06 DIAGNOSIS — C61 Malignant neoplasm of prostate: Secondary | ICD-10-CM

## 2022-05-06 DIAGNOSIS — Z9889 Other specified postprocedural states: Secondary | ICD-10-CM

## 2022-05-06 DIAGNOSIS — E663 Overweight: Secondary | ICD-10-CM

## 2022-05-06 DIAGNOSIS — R7303 Prediabetes: Secondary | ICD-10-CM

## 2022-05-06 DIAGNOSIS — C73 Malignant neoplasm of thyroid gland: Secondary | ICD-10-CM

## 2022-05-06 MED ORDER — AMLODIPINE BESYLATE 2.5 MG PO TABS: 2.5000 mg | ORAL_TABLET | Freq: Every day | ORAL | 3 refills | Status: DC

## 2022-05-06 MED ORDER — PANTOPRAZOLE SODIUM 40 MG PO TBEC: 40.0000 mg | DELAYED_RELEASE_TABLET | Freq: Every day | ORAL | 3 refills | Status: DC

## 2022-05-06 NOTE — Patient Instructions (Addendum)
It was a pleasure meeting you today. Thank you for allowing me to take part in your health care.  Our goals for today as we discussed include:  Refills sent for mediations  Schedule fasting lab appointment.  Fast for 10 hours  Follow up with Endocrinology and Urology as scheduled  Will have someone reach out to you when forms are ready for pick up  Colonoscopy due 09/06/2024 EGD due 09/07/2022 Call Dr Mohammed Kindle to see if referral is needed   If you have any questions or concerns, please do not hesitate to call the office at [phone].  I look forward to our next visit and until then take care and stay safe.  Regards,   Dana Allan, MD   [facility]

## 2022-05-06 NOTE — Progress Notes (Signed)
SUBJECTIVE:   Chief Complaint  Patient presents with   Annual Exam   HPI Patient presents to clinic for annual physical.  No acute concerns today.  Hypertension Asymptomatic. Compliant with amlodipine 2.5 mg daily.  Requesting refills of medication.  GERD Doing well on current PPI.  Requesting refills for Protonix 40 mg daily.  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  Calcitonin levels elevated.  Status post thyroidectomy 12/04/2021.  With oncology and endocrinology and ENT at [facility]  Prostate CA History of radical prostatectomy.  Follows with Dr. Earlene Plater at urology for recurrent prostate CA.Marland Kitchen  Recent completed 33 radiation treatments.   Review of Systems - General ROS: negative   PERTINENT PMH / PSH: Hypertension GERD Medullary thyroid carcinoma Prostate cancer status post radical prostatectomy Recurrent prostate CA Radiation exposure   OBJECTIVE:  BP 130/78   Pulse 70   Temp 97.6 F (36.4 C)   Ht 6\' 3"  (1.905 m)   Wt 202 lb 6.4 oz (91.8 kg)   SpO2 98%   BMI 25.30 kg/m    Physical Exam Vitals reviewed.  Constitutional:      General: He is not in acute distress.    Appearance: He is normal weight. He is not ill-appearing.  HENT:     Head: Normocephalic.  Eyes:     Conjunctiva/sclera: Conjunctivae normal.  Neck:     Thyroid: No thyroid mass.     Comments: Scar noted from recent thyroidectomy Cardiovascular:     Rate and Rhythm: Normal rate and regular rhythm.     Pulses: Normal pulses.  Pulmonary:     Effort: Pulmonary effort is normal.     Breath sounds: Normal breath sounds.  Abdominal:     General: Bowel sounds are normal.  Neurological:     Mental Status: He is alert. Mental status is at baseline.  Psychiatric:        Mood and Affect: Mood normal.        Behavior: Behavior normal.        Thought Content: Thought content normal.        Judgment: Judgment normal.       05/06/2022   11:45 AM  08/27/2021    3:46 PM 07/09/2021    8:29 AM 06/21/2020    8:29 AM 07/21/2019    9:10 AM  Depression screen PHQ 2/9  Decreased Interest 0 0 0 0 0  Down, Depressed, Hopeless 0 0 0 0 0  PHQ - 2 Score 0 0 0 0 0  Altered sleeping 3      Tired, decreased energy 1      Change in appetite 0      Feeling bad or failure about yourself  0      Trouble concentrating 2      Moving slowly or fidgety/restless 0      Suicidal thoughts 0      PHQ-9 Score 6      Difficult doing work/chores Very difficult           05/06/2022   11:45 AM 07/21/2019    9:10 AM  GAD 7 : Generalized Anxiety Score  Nervous, Anxious, on Edge 1 0  Control/stop worrying 0 0  Worry too much - different things 0 0  Trouble relaxing 1 0  Restless 0 0  Easily annoyed or irritable 0 0  Afraid - awful might happen 0 0  Total GAD 7 Score 2 0  Anxiety Difficulty Somewhat difficult  Not difficult at all     ASSESSMENT/PLAN:  Primary hypertension Assessment & Plan: Chronic.  Asymptomatic. Compliant with current medication Refill amlodipine 2.5 mg daily Check labs today  Orders: -     amLODIPine Besylate; Take 1 tablet (2.5 mg total) by mouth daily.  Dispense: 90 tablet; Refill: 3 -     CBC with Differential/Platelet -     Vitamin B12 -     Comprehensive metabolic panel  Gastroesophageal reflux disease Assessment & Plan: Chronic.  Doing well on current PPI Refill Protonix 40 mg daily  Orders: -     Pantoprazole Sodium; Take 1 tablet (40 mg total) by mouth daily. 30 minutes  Dispense: 90 tablet; Refill: 3  Hyperglycemia -     Hemoglobin A1c  Overweight (BMI 25.0-29.9) -     Lipid panel -     Vitamin B12  Prostate cancer (HCC) Assessment & Plan: Chronic.  History of radical prostatectomy in 2011.  Now recurrent prostate CA.  Follows with Dr. Earlene Plater at urology. Reports received 33 radiation treatments Continue follow-up with urology FMLA forms completed   Thyroid cancer, medullary carcinoma [facility]) Assessment &  Plan: New diagnosis.  Status post thyroidectomy 11/23.  Doing well Follows with endocrinology, ENT, oncology, genetics Forms completed for FMLA   H/O total thyroidectomy Assessment & Plan: Recent thyroidectomy 11/23 for medullary thyroid cancer. Doing well Follow-up with endocrinology, ENT and oncology.   Prediabetes Assessment & Plan: Chronic.  Asymptomatic. Check A1c  Orders: -     Lipid panel  Forms completed for FMLA.  HCM Colonoscopy up-to-date.  Due 8/26 Tdap up-to-date Shingles vaccination completed Pneumonia vaccination completed Lipid screening links Negative GAD-7/PHQ-9  PDMP reviewed  Return if symptoms worsen or fail to improve.  Dana Allan, MD

## 2022-05-10 ENCOUNTER — Encounter: Payer: Self-pay | Admitting: Family Medicine

## 2022-05-14 ENCOUNTER — Telehealth: Payer: Self-pay | Admitting: Family Medicine

## 2022-05-14 ENCOUNTER — Other Ambulatory Visit: Payer: 59

## 2022-05-14 DIAGNOSIS — E890 Postprocedural hypothyroidism: Secondary | ICD-10-CM | POA: Insufficient documentation

## 2022-05-14 DIAGNOSIS — C73 Malignant neoplasm of thyroid gland: Secondary | ICD-10-CM | POA: Insufficient documentation

## 2022-05-14 DIAGNOSIS — C61 Malignant neoplasm of prostate: Secondary | ICD-10-CM | POA: Insufficient documentation

## 2022-05-15 LAB — COMPREHENSIVE METABOLIC PANEL
ALT: 27 IU/L (ref 0–44)
AST: 28 IU/L (ref 0–40)
Albumin/Globulin Ratio: 1.2 (ref 1.2–2.2)
Albumin: 4.3 g/dL (ref 3.9–4.9)
Alkaline Phosphatase: 76 IU/L (ref 44–121)
BUN/Creatinine Ratio: 16 (ref 10–24)
BUN: 15 mg/dL (ref 8–27)
Bilirubin Total: 0.4 mg/dL (ref 0.0–1.2)
CO2: 22 mmol/L (ref 20–29)
Calcium: 9.3 mg/dL (ref 8.6–10.2)
Chloride: 104 mmol/L (ref 96–106)
Creatinine, Ser: 0.93 mg/dL (ref 0.76–1.27)
Globulin, Total: 3.5 g/dL (ref 1.5–4.5)
Glucose: 91 mg/dL (ref 70–99)
Potassium: 4.7 mmol/L (ref 3.5–5.2)
Sodium: 141 mmol/L (ref 134–144)
Total Protein: 7.8 g/dL (ref 6.0–8.5)
eGFR: 88 mL/min/{1.73_m2} (ref >59)

## 2022-05-15 LAB — CBC WITH DIFFERENTIAL/PLATELET
Basophils Absolute: 0 10*3/uL (ref 0.0–0.2)
Basos: 1 %
EOS (ABSOLUTE): 0.1 10*3/uL (ref 0.0–0.4)
Eos: 2 %
Hematocrit: 40.9 % (ref 37.5–51.0)
Hemoglobin: 13.4 g/dL (ref 13.0–17.7)
Immature Grans (Abs): 0 10*3/uL (ref 0.0–0.1)
Immature Granulocytes: 0 %
Lymphocytes Absolute: 0.9 10*3/uL (ref 0.7–3.1)
Lymphs: 23 %
MCH: 28.8 pg (ref 26.6–33.0)
MCHC: 32.8 g/dL (ref 31.5–35.7)
MCV: 88 fL (ref 79–97)
Monocytes Absolute: 0.4 10*3/uL (ref 0.1–0.9)
Monocytes: 11 %
Neutrophils Absolute: 2.4 10*3/uL (ref 1.4–7.0)
Neutrophils: 63 %
Platelets: 170 10*3/uL (ref 150–450)
RBC: 4.65 x10E6/uL (ref 4.14–5.80)
RDW: 15 % (ref 11.6–15.4)
WBC: 3.8 10*3/uL (ref 3.4–10.8)

## 2022-05-15 LAB — HEMOGLOBIN A1C
Est. average glucose Bld gHb Est-mCnc: 123 mg/dL
Hgb A1c MFr Bld: 5.9 % — ABNORMAL HIGH (ref 4.8–5.6)

## 2022-05-15 LAB — LIPID PANEL
Chol/HDL Ratio: 4.1 ratio (ref 0.0–5.0)
Cholesterol, Total: 195 mg/dL (ref 100–199)
HDL: 48 mg/dL (ref >39)
LDL Chol Calc (NIH): 123 mg/dL — ABNORMAL HIGH (ref 0–99)
Triglycerides: 134 mg/dL (ref 0–149)
VLDL Cholesterol Cal: 24 mg/dL (ref 5–40)

## 2022-05-15 LAB — VITAMIN B12: Vitamin B-12: 1023 pg/mL (ref 232–1245)

## 2022-05-15 NOTE — Telephone Encounter (Signed)
I have made some corrections, faxed, & notified pt that he may pick up copy at front desk.  Holding copy at desk before sending to scan.

## 2022-05-16 ENCOUNTER — Encounter: Payer: Self-pay | Admitting: Family Medicine

## 2022-05-16 NOTE — Assessment & Plan Note (Signed)
Chronic.  Asymptomatic. Compliant with current medication Refill amlodipine 2.5 mg daily Check labs today

## 2022-05-16 NOTE — Assessment & Plan Note (Signed)
Chronic.  Doing well on current PPI Refill Protonix 40 mg daily

## 2022-05-16 NOTE — Assessment & Plan Note (Signed)
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 Doing well Follow-up with endocrinology, ENT and oncology.

## 2022-05-16 NOTE — Assessment & Plan Note (Signed)
Chronic.  Asymptomatic. Check A1c

## 2022-05-16 NOTE — Assessment & Plan Note (Signed)
New diagnosis.  Status post thyroidectomy 11/23.  Doing well Follows with endocrinology, ENT, oncology, genetics Forms completed for FMLA

## 2022-05-16 NOTE — Assessment & Plan Note (Signed)
Chronic.  History of radical prostatectomy in 2011.  Now recurrent prostate CA.  Follows with Dr. Earlene Plater at urology. Reports received 33 radiation treatments Continue follow-up with urology FMLA forms completed

## 2022-05-18 ENCOUNTER — Telehealth: Payer: Self-pay | Admitting: Family Medicine

## 2022-05-18 MED ORDER — ROSUVASTATIN CALCIUM 40 MG PO TABS: 40.0000 mg | ORAL_TABLET | Freq: Every day | ORAL | 5 refills | Status: DC

## 2022-05-18 NOTE — Telephone Encounter (Signed)
Prescription Request  05/18/2022  LOV: 05/06/2022  What is the name of the medication or equipment? Crestor 40 mg  Have you contacted your pharmacy to request a refill? No   Which pharmacy would you like this sent to?  [id number] [place], [place] - [address] [place] [address] Phone: [phone] Fax: [phone]    Patient notified that their request is being sent to the clinical staff for review and that they should receive a response within 2 business days.   Please advise at Mobile [phone] (mobile)   As per pt, provider him a message to start this med.

## 2022-05-18 NOTE — Telephone Encounter (Signed)
I spoke with patient on Friday & faxed forms to the number on paperwork. Pt came in today pick up forms. And aware of where I faxed it too.

## 2022-05-18 NOTE — Telephone Encounter (Signed)
Per labs 05/14/22: LDL increased to 123.  10-year ASCVD risk 21.5% of major cardiovascular event such as heart attack, stroke.  Recommend restarting statin therapy.  Recommend Crestor 40 mg daily.  If you would like to start medication please let me know and I will send in prescription.      Rx sent to pharmacy.

## 2022-05-18 NOTE — Telephone Encounter (Signed)
Patient states he is returning our call.  Patient states he would like for [place] to please fax the paperwork to him at [phone].

## 2022-07-07 IMAGING — DX DG SHOULDER 2+V*L*
3 series · 3 of 3 positions shown · non-contrast
Comparison: None.

CLINICAL DATA: Left shoulder pain

EXAM:
LEFT SHOULDER - 2+ VIEW

[shoulder internal rotation ap]
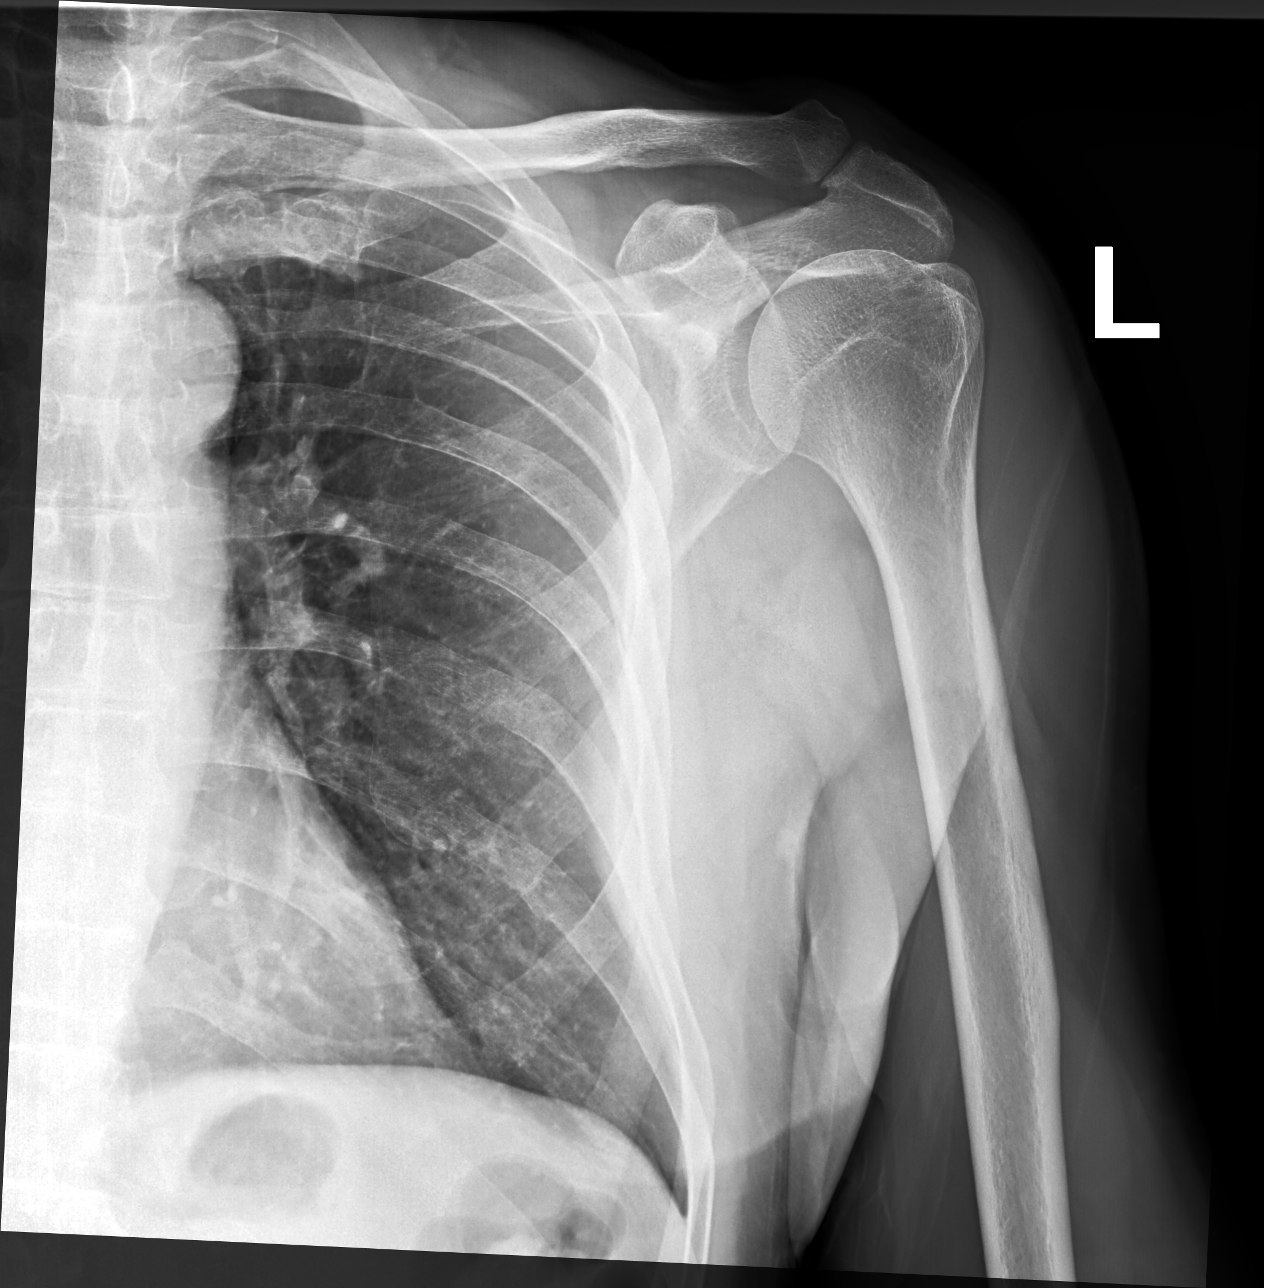

[shoulder external rotation ap]
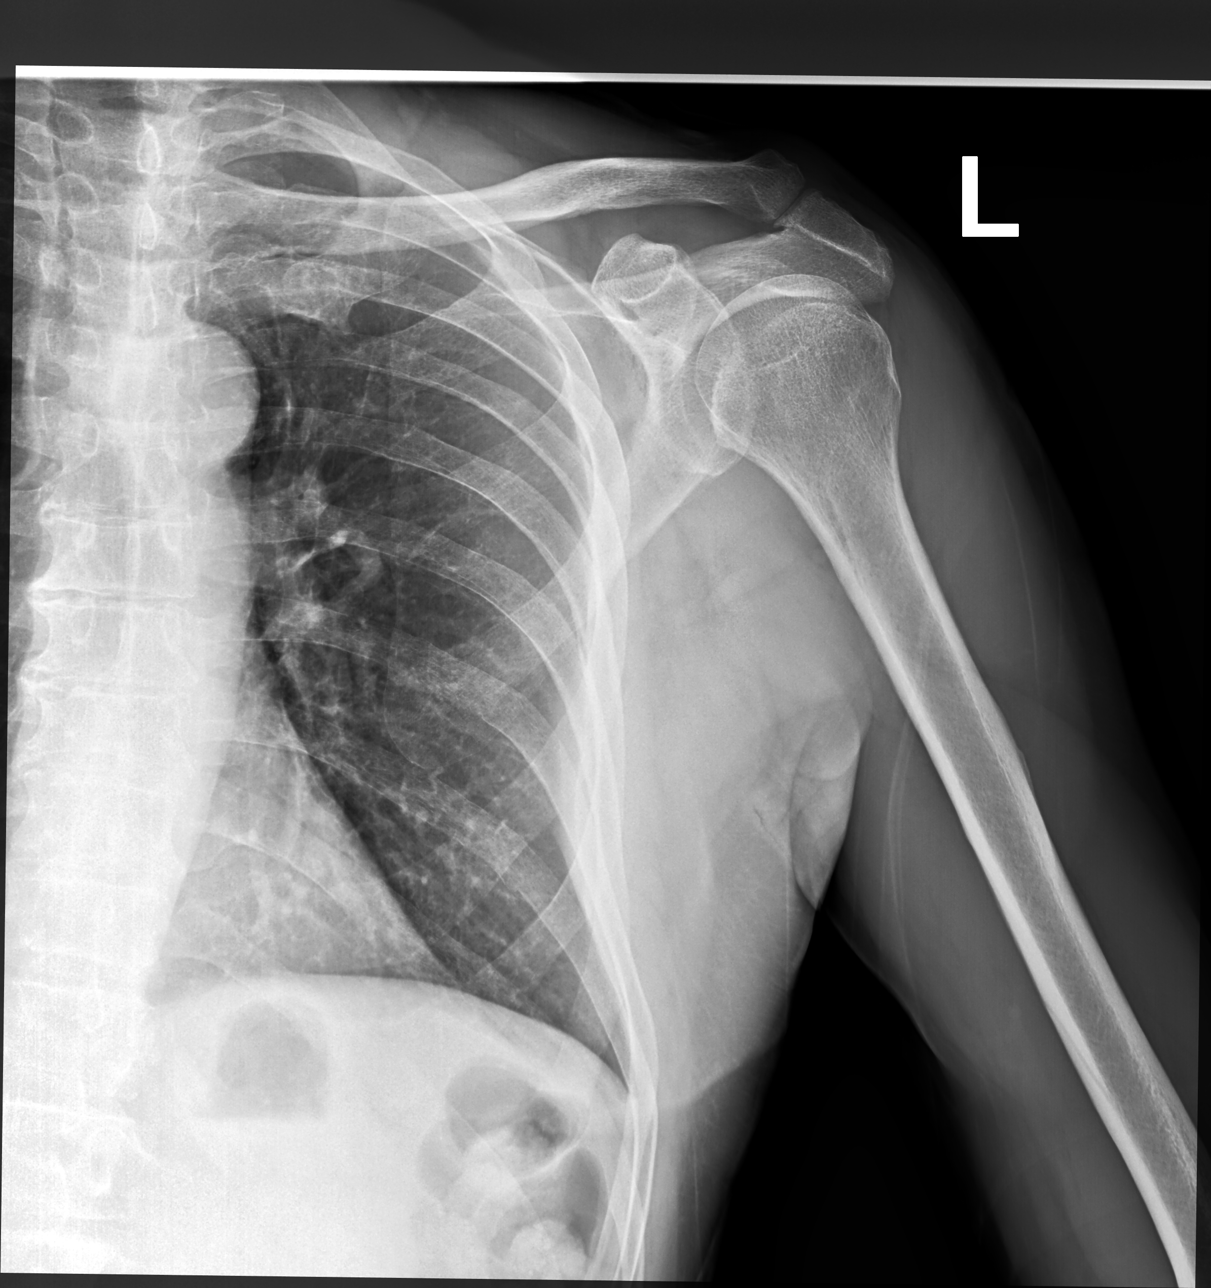

[shoulder y view]
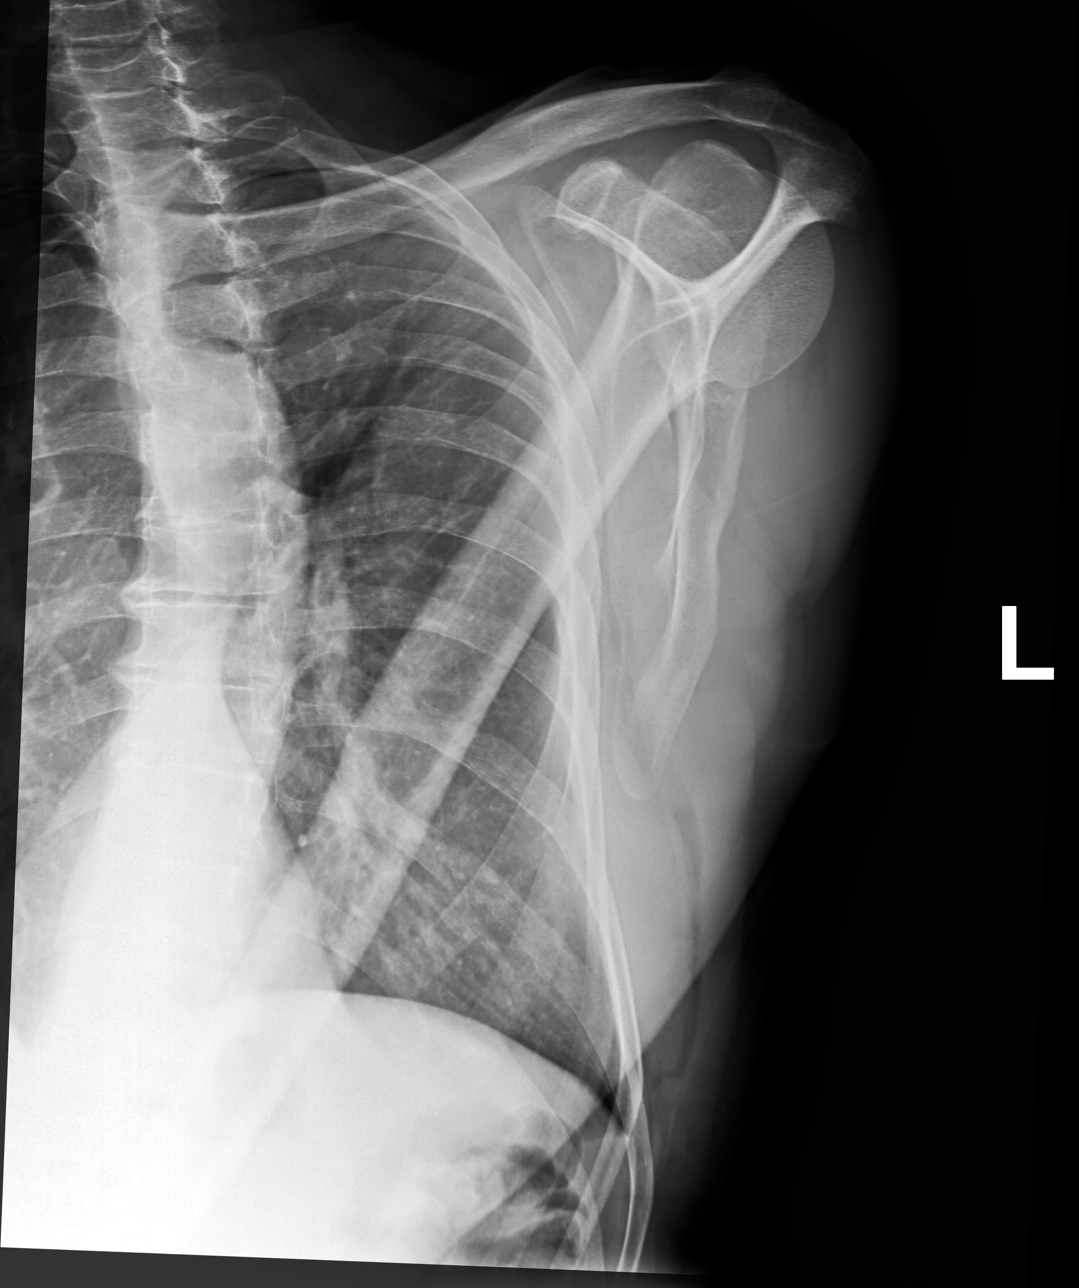

[3 of 3 positions shown; findings below may reference images not displayed]

FINDINGS: No acute bony abnormality. Specifically, no fracture, subluxation,
or dislocation. Mild glenohumeral and acromioclavicular arthrosis.
Few subcortical cystic changes at the footprint of the rotator cuff
insertions may reflect some underlying rotator cuff tendinopathy.
Soft tissues left chest wall are free of acute abnormality.
IMPRESSION: 1. No acute bony abnormality.
2. Mild glenohumeral and acromioclavicular arthrosis.
3. Subcortical cystic changes at the rotator cuff footprint may
reflect underlying tendinopathy.

## 2022-08-05 ENCOUNTER — Encounter: Payer: Self-pay | Admitting: Family Medicine

## 2022-09-03 ENCOUNTER — Other Ambulatory Visit: Payer: Self-pay | Admitting: Family Medicine

## 2022-09-10 ENCOUNTER — Telehealth: Payer: Self-pay | Admitting: Family Medicine

## 2022-09-10 NOTE — Telephone Encounter (Signed)
Copied from CRM [id number]. Topic: Medicare AWV >> Sep 10, 2022 11:44 AM Payton Doughty wrote: Reason for CRM: Called 09/10/2022 to sched AWV - NO VOICEMAIL  Verlee Rossetti; Care Guide Ambulatory Clinical Support [place] [facility] Direct Dial: [phone]

## 2022-10-30 ENCOUNTER — Encounter: Payer: Self-pay | Admitting: Family Medicine

## 2022-11-03 NOTE — Addendum Note (Signed)
Addended by: Vertis Kelch on: 11/03/2022 03:02 PM   Modules accepted: Orders

## 2022-11-03 NOTE — Telephone Encounter (Signed)
Referral placed and pt is aware. 

## 2022-12-02 ENCOUNTER — Encounter: Payer: Self-pay | Admitting: Family Medicine

## 2022-12-02 ENCOUNTER — Ambulatory Visit: Payer: 59 | Admitting: Family Medicine

## 2022-12-02 VITALS — BP 130/70 | HR 84 | Temp 98.2°F | Resp 16 | Ht 75.0 in | Wt 199.1 lb

## 2022-12-02 DIAGNOSIS — G44221 Chronic tension-type headache, intractable: Secondary | ICD-10-CM

## 2022-12-02 DIAGNOSIS — Z9109 Other allergy status, other than to drugs and biological substances: Secondary | ICD-10-CM

## 2022-12-02 DIAGNOSIS — E785 Hyperlipidemia, unspecified: Secondary | ICD-10-CM | POA: Diagnosis not present

## 2022-12-02 DIAGNOSIS — R7309 Other abnormal glucose: Secondary | ICD-10-CM

## 2022-12-02 DIAGNOSIS — I10 Essential (primary) hypertension: Secondary | ICD-10-CM

## 2022-12-02 DIAGNOSIS — R7303 Prediabetes: Secondary | ICD-10-CM

## 2022-12-02 MED ORDER — AMLODIPINE BESYLATE 2.5 MG PO TABS
2.5000 mg | ORAL_TABLET | Freq: Two times a day (BID) | ORAL | 3 refills | Status: DC
Start: 2022-12-02 — End: 2023-06-18

## 2022-12-02 MED ORDER — FLUTICASONE PROPIONATE 50 MCG/ACT NA SUSP: 2.0000 | Freq: Every day | NASAL | 6 refills | Status: DC

## 2022-12-02 NOTE — Patient Instructions (Addendum)
It was a pleasure meeting you today. Thank you for allowing me to take part in your health care.  Our goals for today as we discussed include:  Increase Amlodipine to 2.5 mg two times a day Continue to monitor blood pressure at home.  Goal <140/90.  If <100/60 hold and notify MD  We will get some labs today.  If they are abnormal or we need to do something about them, I will call you.  If they are normal, I will send you a message on MyChart (if it is active) or a letter in the mail.  If you don't hear from [place] in 2 weeks, please call the office at the number below.    This is a list of the screening recommended for you and due dates:  Health Maintenance  Topic Date Due   Flu Shot  08/13/2022   COVID-19 Vaccine (7 - 2023-24 season) 09/13/2022   Colon Cancer Screening  09/06/2024   DTaP/Tdap/Td vaccine (2 - Td or Tdap) 06/29/2031   Pneumonia Vaccine  Completed   Hepatitis C Screening  Completed   Zoster (Shingles) Vaccine  Completed   HPV Vaccine  Aged Out    Please have Flu vaccine sent to PCP  Follow up as needed  If you have any questions or concerns, please do not hesitate to call the office at [phone].  I look forward to our next visit and until then take care and stay safe.  Regards,   Dana Allan, MD   [facility]

## 2022-12-02 NOTE — Progress Notes (Signed)
SUBJECTIVE:   Chief Complaint  Patient presents with   Hypertension    141/98 162/91   HPI Presents to clinic for follow up chronic disease management  Discussed the use of AI scribe software for clinical note transcription with the patient, who gave verbal consent to proceed.  History of Present Illness The patient presents for a follow-up visit with concerns about fluctuating blood pressure readings. They have been monitoring their blood pressure at home and report readings that have been "up and down." The patient has been taking 2.5mg  of their blood pressure medication twice daily for the past one to two weeks. Despite this, they report readings as high as 162/91 and as low as 122/85.  The patient has been making efforts to maintain a healthy diet and engage in regular exercise. They report no significant changes in their diet or exercise routine. However, they have been dealing with increased stress due to a family member's complex medical issues.  In addition to blood pressure concerns, the patient reports daily headaches, which they describe as a constant, slight headache. They have been managing the headaches with Tylenol, which they report provides some relief. The patient also mentions a single episode of chest pain, which they managed with aspirin and Tylenol. The pain has not recurred since.  The patient has a history of thyroid removal and has been using Flonase at night for the past month. They also report phlegm production, which they are considering discussing with an ENT specialist. The patient has recently cut out caffeine from their diet.  The patient's sleep is reported to be not optimal, with an average of six to seven hours per night. They also report nocturia. The patient's fluid intake is approximately 32 ounces per day, and they acknowledge that they should probably increase this.  The patient's medical history includes prediabetes, with a recent HbA1c of 5.9.  They also have a history of radiation treatment. The patient is due for an eye examination and a follow-up with their endocrinologist.    PERTINENT PMH / PSH: As above  OBJECTIVE:  BP 130/70   Pulse 84   Temp 98.2 F (36.8 C)   Resp 16   Ht 6\' 3"  (1.905 m)   Wt 199 lb 2 oz (90.3 kg)   SpO2 97%   BMI 24.89 kg/m    Physical Exam Vitals reviewed.  Constitutional:      General: He is not in acute distress.    Appearance: Normal appearance. He is normal weight. He is not ill-appearing, toxic-appearing or diaphoretic.  Eyes:     General:        Right eye: No discharge.        Left eye: No discharge.     Conjunctiva/sclera: Conjunctivae normal.  Cardiovascular:     Rate and Rhythm: Normal rate and regular rhythm.     Heart sounds: Normal heart sounds.  Pulmonary:     Effort: Pulmonary effort is normal.     Breath sounds: Normal breath sounds.  Abdominal:     General: Bowel sounds are normal.  Musculoskeletal:        General: Normal range of motion.     Cervical back: Normal range of motion.  Skin:    General: Skin is warm and dry.  Neurological:     General: No focal deficit present.     Mental Status: He is alert and oriented to person, place, and time. Mental status is at baseline.     Cranial  Nerves: No cranial nerve deficit.     Sensory: No sensory deficit.     Motor: No weakness.     Coordination: Coordination normal.  Psychiatric:        Mood and Affect: Mood normal.        Behavior: Behavior normal.        Thought Content: Thought content normal.        Judgment: Judgment normal.        12/02/2022    8:09 AM 05/06/2022   11:45 AM 08/27/2021    3:46 PM 07/09/2021    8:29 AM 06/21/2020    8:29 AM  Depression screen PHQ 2/9  Decreased Interest 0 0 0 0 0  Down, Depressed, Hopeless 0 0 0 0 0  PHQ - 2 Score 0 0 0 0 0  Altered sleeping 1 3     Tired, decreased energy 0 1     Change in appetite 0 0     Feeling bad or failure about yourself  0 0     Trouble  concentrating 0 2     Moving slowly or fidgety/restless 0 0     Suicidal thoughts 0 0     PHQ-9 Score 1 6     Difficult doing work/chores Not difficult at all Very difficult         12/02/2022    8:09 AM 05/06/2022   11:45 AM 07/21/2019    9:10 AM  GAD 7 : Generalized Anxiety Score  Nervous, Anxious, on Edge 0 1 0  Control/stop worrying 0 0 0  Worry too much - different things 0 0 0  Trouble relaxing 0 1 0  Restless 0 0 0  Easily annoyed or irritable 0 0 0  Afraid - awful might happen 0 0 0  Total GAD 7 Score 0 2 0  Anxiety Difficulty Not difficult at all Somewhat difficult Not difficult at all    ASSESSMENT/PLAN:  Primary hypertension Assessment & Plan: Fluctuating blood pressure readings at home, with some readings in the hypertensive range. Patient has increased his antihypertensive medication dosage without medical consultation. Blood pressure in office was within normal limits.  -Continue current antihypertensive medication at increased dose (2.5mg  twice daily), but advised caution and to monitor for symptoms of hypotension. -Check blood pressure regularly at home and reduce medication back to once daily if systolic blood pressure drops below 100. -Plan to recheck blood pressure in office. -Cmet today  Orders: -     Basic metabolic panel -     amLODIPine Besylate; Take 1 tablet (2.5 mg total) by mouth 2 (two) times daily.  Dispense: 180 tablet; Refill: 3  Abnormal glucose -     Hemoglobin A1c  Hyperlipidemia, unspecified hyperlipidemia type Assessment & Plan: LDL previously elevated 123.  Was started on Crestor  -Repeat fasting lipids today    Orders: -     Lipid panel  Chronic tension-type headache, intractable Assessment & Plan: Daily headaches, possibly related to stress, dehydration, or caffeine withdrawal. No neurological symptoms reported. Symptoms somewhat improve with use of Tylenol. -Increase hydration. -Continue use of Tylenol as needed for headache  relief. -Consider allergy medication such as Zyrtec. -Consider imaging if symptoms do not improve with these measures.   Prediabetes Assessment & Plan: A1C of 5.9, indicating prediabetes. -Continue monitoring blood glucose levels. -Plan to recheck A1C.   Environmental allergies Assessment & Plan: Increased post nasal discharge .  Recently restart OTC Flonase -Refill Flonase nasal spray 2 spray at  night -Humidified air -Trial OTC zyrtec -Follow up if no improvement  Orders: -     Fluticasone Propionate; Place 2 sprays into both nostrils daily.  Dispense: 16 g; Refill: 6   General Health Maintenance -Plan to have eyes checked before the end of the year. -[facility]   PDMP reviewed  Return if symptoms worsen or fail to improve, for PCP.--  Dana Allan, MD6 +

## 2022-12-03 LAB — LIPID PANEL
Chol/HDL Ratio: 2.4 ratio (ref 0.0–5.0)
Cholesterol, Total: 109 mg/dL (ref 100–199)
HDL: 45 mg/dL (ref >39)
LDL Chol Calc (NIH): 48 mg/dL (ref 0–99)
Triglycerides: 77 mg/dL (ref 0–149)
VLDL Cholesterol Cal: 16 mg/dL (ref 5–40)

## 2022-12-03 LAB — BASIC METABOLIC PANEL
BUN/Creatinine Ratio: 18 (ref 10–24)
BUN: 15 mg/dL (ref 8–27)
CO2: 23 mmol/L (ref 20–29)
Calcium: 9.3 mg/dL (ref 8.6–10.2)
Chloride: 104 mmol/L (ref 96–106)
Creatinine, Ser: 0.82 mg/dL (ref 0.76–1.27)
Glucose: 91 mg/dL (ref 70–99)
Potassium: 4.6 mmol/L (ref 3.5–5.2)
Sodium: 139 mmol/L (ref 134–144)
eGFR: 94 mL/min/{1.73_m2} (ref >59)

## 2022-12-03 LAB — HEMOGLOBIN A1C
Est. average glucose Bld gHb Est-mCnc: 126 mg/dL
Hgb A1c MFr Bld: 6 % — ABNORMAL HIGH (ref 4.8–5.6)

## 2022-12-08 NOTE — Telephone Encounter (Signed)
NOTED

## 2022-12-08 NOTE — Telephone Encounter (Signed)
Noted  

## 2022-12-09 ENCOUNTER — Encounter: Payer: Self-pay | Admitting: Family Medicine

## 2022-12-11 ENCOUNTER — Encounter: Payer: Self-pay | Admitting: Family Medicine

## 2022-12-11 DIAGNOSIS — R519 Headache, unspecified: Secondary | ICD-10-CM | POA: Insufficient documentation

## 2022-12-11 DIAGNOSIS — Z9109 Other allergy status, other than to drugs and biological substances: Secondary | ICD-10-CM | POA: Insufficient documentation

## 2022-12-11 NOTE — Assessment & Plan Note (Signed)
Increased post nasal discharge .  Recently restart OTC Flonase -Refill Flonase nasal spray 2 spray at night -Humidified air -Trial OTC zyrtec -Follow up if no improvement

## 2022-12-11 NOTE — Assessment & Plan Note (Addendum)
Fluctuating blood pressure readings at home, with some readings in the hypertensive range. Patient has increased his antihypertensive medication dosage without medical consultation. Blood pressure in office was within normal limits.  -Continue current antihypertensive medication at increased dose (2.5mg  twice daily), but advised caution and to monitor for symptoms of hypotension. -Check blood pressure regularly at home and reduce medication back to once daily if systolic blood pressure drops below 100. -Plan to recheck blood pressure in office. -Cmet today

## 2022-12-11 NOTE — Assessment & Plan Note (Signed)
LDL previously elevated 123.  Was started on Crestor  -Repeat fasting lipids today

## 2022-12-11 NOTE — Assessment & Plan Note (Signed)
Daily headaches, possibly related to stress, dehydration, or caffeine withdrawal. No neurological symptoms reported. Symptoms somewhat improve with use of Tylenol. -Increase hydration. -Continue use of Tylenol as needed for headache relief. -Consider allergy medication such as Zyrtec. -Consider imaging if symptoms do not improve with these measures.

## 2022-12-11 NOTE — Assessment & Plan Note (Signed)
A1C of 5.9, indicating prediabetes. -Continue monitoring blood glucose levels. -Plan to recheck A1C.

## 2023-01-11 ENCOUNTER — Encounter: Payer: Self-pay | Admitting: Family Medicine

## 2023-01-27 ENCOUNTER — Telehealth: Payer: Self-pay | Admitting: Family Medicine

## 2023-01-27 NOTE — Telephone Encounter (Signed)
Copied from CRM [id number]. Topic: Clinical - Medication Refill >> Jan 26, 2023  5:09 PM Brandon Huffman wrote: Most Recent Primary Care Visit:  Provider: Dana Allan  Department: [facility]  Visit Type: OFFICE VISIT  Date: 12/02/2022  Medication: amLODipine (NORVASC) 2.5 MG tablet [facility] and his orders state 2 tablets daily. [facility]   Has the patient contacted their pharmacy? No (Agent: If no, request that the patient contact the pharmacy for the refill. If patient does not wish to contact the pharmacy document the reason why and proceed with request.) (Agent: If yes, when and what did the pharmacy advise?)  Is this the correct pharmacy for this prescription? [facility] If no, delete pharmacy and type the correct one.  This is the patient's preferred pharmacy:  [id number] [place], [place] - [address] [place] [address] Phone: [phone] Fax: [phone]   Has the prescription been filled recently? No  Is the patient out of the medication? Yes   Has the patient been seen for an appointment in the last year OR does the patient have an upcoming appointment? Yes  Can we respond through MyChart? No  Agent: Please be advised that Rx refills may take up to 3 business days. We ask that you follow-up with your pharmacy.

## 2023-01-27 NOTE — Telephone Encounter (Signed)
 Called pt and he stated that he is with a client and would give us  a call back.

## 2023-01-27 NOTE — Telephone Encounter (Signed)
Spoke to pt see previous phone note.

## 2023-01-27 NOTE — Telephone Encounter (Signed)
 Called and spoke to pt he was able to get the right numbers of pills.

## 2023-02-03 ENCOUNTER — Telehealth: Payer: Self-pay | Admitting: Family Medicine

## 2023-02-03 NOTE — Telephone Encounter (Signed)
Copied from CRM [id number]. Topic: Medicare AWV >> Feb 03, 2023 10:22 AM Payton Doughty wrote: Reason for CRM: Called LVM 02/03/2023 to schedule AWV. Please schedule office or virtual visits  Verlee Rossetti; Care Guide Ambulatory Clinical Support [place] [facility] Direct Dial: [phone]

## 2023-04-02 ENCOUNTER — Ambulatory Visit (INDEPENDENT_AMBULATORY_CARE_PROVIDER_SITE_OTHER): Admitting: Family Medicine

## 2023-04-02 ENCOUNTER — Encounter: Payer: Self-pay | Admitting: Family Medicine

## 2023-04-02 VITALS — BP 132/82 | HR 63 | Temp 97.7°F | Resp 20 | Ht 75.0 in | Wt 205.0 lb

## 2023-04-02 DIAGNOSIS — M26609 Unspecified temporomandibular joint disorder, unspecified side: Secondary | ICD-10-CM

## 2023-04-02 DIAGNOSIS — E782 Mixed hyperlipidemia: Secondary | ICD-10-CM

## 2023-04-02 DIAGNOSIS — G4730 Sleep apnea, unspecified: Secondary | ICD-10-CM

## 2023-04-02 DIAGNOSIS — K219 Gastro-esophageal reflux disease without esophagitis: Secondary | ICD-10-CM

## 2023-04-02 MED ORDER — ROSUVASTATIN CALCIUM 40 MG PO TABS: 40.0000 mg | ORAL_TABLET | Freq: Every day | ORAL | 3 refills | Status: AC

## 2023-04-02 MED ORDER — TIZANIDINE HCL 4 MG PO TABS: 2.0000 mg | ORAL_TABLET | Freq: Four times a day (QID) | ORAL | 0 refills | Status: DC | PRN

## 2023-04-02 MED ORDER — NABUMETONE 500 MG PO TABS: 500.0000 mg | ORAL_TABLET | Freq: Every day | ORAL | 0 refills | Status: AC

## 2023-04-02 MED ORDER — PANTOPRAZOLE SODIUM 40 MG PO TBEC: 40.0000 mg | DELAYED_RELEASE_TABLET | Freq: Every day | ORAL | 3 refills | Status: DC

## 2023-04-02 NOTE — Patient Instructions (Addendum)
 It was a pleasure meeting you today. Thank you for allowing me to take part in your health care.  Our goals for today as we discussed include:  Start Zanaflex 2 mg at night.  Can increase to 4 mg Start Relafen 500 mg daily as needed for discomfort Heat/ ice as needed Can send referral to physical therapy if no improvement   This is a list of the screening recommended for you and due dates:  Health Maintenance  Topic Date Due   Medicare Annual Wellness Visit  06/21/2021   COVID-19 Vaccine (8 - Moderna risk 2024-25 season) 05/07/2023   Colon Cancer Screening  09/06/2024   DTaP/Tdap/Td vaccine (2 - Td or Tdap) 06/29/2031   Pneumonia Vaccine  Completed   Flu Shot  Completed   Hepatitis C Screening  Completed   Zoster (Shingles) Vaccine  Completed   HPV Vaccine  Aged Out     If you have any questions or concerns, please do not hesitate to call the office at [phone].  I look forward to our next visit and until then take care and stay safe.  Regards,   Dana Allan, MD   [facility]

## 2023-04-02 NOTE — Progress Notes (Signed)
 SUBJECTIVE:   Chief Complaint  Patient presents with   Temporomandibular Joint Pain    Discuss options.    HPI Presents for acute visit  Discussed the use of AI scribe software for clinical note transcription with the patient, who gave verbal consent to proceed.  History of Present Illness Brandon Huffman is a 73 year old male who presents with TMJ pain and concerns about atherosclerosis. He is accompanied by his wife, a Education officer, community. He was referred by Dr. Carol Ada for evaluation of calcified atherosclerotic disease.  He experiences temporomandibular joint (TMJ) pain, particularly on the right side of his jaw, with clicking and pain when opening his mouth. His wife made a mouth guard for him to address snoring, but wearing it increases soreness. He has not engaged in TMJ exercises or physical therapy. He uses ibuprofen and Tylenol for pain management, and occasionally meloxicam, although it is not recommended for his age. No teeth grinding has been noticed, but his wife has not observed it over his snoring. No pain is experienced when pressure is applied externally, and there is no pain in the ear itself.  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 He is on high-dose statin therapy. He recalls a friend undergoing a procedure for artery blockage and wonders if further investigation is needed for his condition. No symptoms of dizziness or other related symptoms are reported. The CT scan, performed on January 07, 2023, also revealed a 7mm nodule in the upper lobe of the lung, which has been previously addressed.  He mentions needing a reevaluation for sleep apnea as it has been a while since his last sleep study. The mouth guard for snoring exacerbates his TMJ symptoms.    PERTINENT PMH / PSH: As above  OBJECTIVE:  BP 132/82   Pulse 63   Temp 97.7 F (36.5 C)    Resp 20   Ht 6\' 3"  (1.905 m)   Wt 205 lb (93 kg)   SpO2 97%   BMI 25.62 kg/m    Physical Exam Vitals reviewed.  Constitutional:      General: He is not in acute distress.    Appearance: Normal appearance. He is normal weight. He is not ill-appearing, toxic-appearing or diaphoretic.  HENT:     Head:     Jaw: Tenderness and pain on movement present. No swelling.  Eyes:     General:        Right eye: No discharge.        Left eye: No discharge.  Cardiovascular:     Rate and Rhythm: Normal rate and regular rhythm.     Heart sounds: Normal heart sounds.  Pulmonary:     Effort: Pulmonary effort is normal.     Breath sounds: Normal breath sounds.  Abdominal:     General: Bowel sounds are normal.  Musculoskeletal:        General: Normal range of motion.     Cervical back: Normal range of motion.  Skin:    General: Skin is warm and dry.  Neurological:     Mental Status: He is alert and oriented to person, place, and time. Mental status is at baseline.  Psychiatric:        Mood and Affect: Mood normal.        Behavior: Behavior normal.        Thought Content: Thought content normal.  Judgment: Judgment normal.           04/02/2023    2:10 PM 12/02/2022    8:09 AM 05/06/2022   11:45 AM 08/27/2021    3:46 PM 07/09/2021    8:29 AM  Depression screen PHQ 2/9  Decreased Interest 0 0 0 0 0  Down, Depressed, Hopeless 0 0 0 0 0  PHQ - 2 Score 0 0 0 0 0  Altered sleeping 3 1 3     Tired, decreased energy 0 0 1    Change in appetite 0 0 0    Feeling bad or failure about yourself  0 0 0    Trouble concentrating 0 0 2    Moving slowly or fidgety/restless 0 0 0    Suicidal thoughts 0 0 0    PHQ-9 Score 3 1 6     Difficult doing work/chores Somewhat difficult Not difficult at all Very difficult        04/02/2023    2:10 PM 12/02/2022    8:09 AM 05/06/2022   11:45 AM 07/21/2019    9:10 AM  GAD 7 : Generalized Anxiety Score  Nervous, Anxious, on Edge 0 0 1 0  Control/stop  worrying 0 0 0 0  Worry too much - different things 0 0 0 0  Trouble relaxing 0 0 1 0  Restless 0 0 0 0  Easily annoyed or irritable 0 0 0 0  Afraid - awful might happen 0 0 0 0  Total GAD 7 Score 0 0 2 0  Anxiety Difficulty Not difficult at all Not difficult at all Somewhat difficult Not difficult at all    ASSESSMENT/PLAN:  TMJ (temporomandibular joint syndrome) Assessment & Plan: 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 Discussed benefits of mouth guard use and arthroscopy if conservative measures fail. Prescribed Zanaflex for muscle relaxation and pain relief. - Provide TMJ exercises. - Recommend hot and cold compresses. - Prescribe Zanaflex 2 mg at night, option to increase to 4 mg if not drowsy. - Refalen 500 mg BID prn - Advise continued nightly mouth guard use. - Consider referral to dentist for mouth guard reevaluation and jaw adjustment. - Discuss possibility of arthroscopy if conservative measures fail.  Orders: -     tiZANidine HCl; Take 0.5 tablets (2 mg total) by mouth every 6 (six) hours as needed for muscle spasms.  Dispense: 60 tablet; Refill: 0 -     Nabumetone; Take 1 tablet (500 mg total) by mouth daily.  Dispense: 30 tablet; Refill: 0  Gastroesophageal reflux disease without esophagitis Assessment & Plan: Refill Protonix  Orders: -     Pantoprazole Sodium; Take 1 tablet (40 mg total) by mouth daily. 30 minutes  Dispense: 90 tablet; Refill: 3  Mixed hyperlipidemia Assessment & Plan: On statin -Refill Crestor   Orders: -     Rosuvastatin Calcium; Take 1 tablet (40 mg total) by mouth daily.  Dispense: 90 tablet; Refill: 3  Sleep apnea, unspecified type Assessment & Plan: Requires reevaluation for sleep apnea. Current mouth guard causes soreness. Planned referral for sleep study. - Refer for sleep study to reevaluate sleep apnea.  Orders: -     Pulmonary Visit      PDMP reviewed  Return if symptoms worsen or fail to improve, for  PCP.  Dana Allan, MD

## 2023-04-05 ENCOUNTER — Encounter: Payer: Self-pay | Admitting: Family Medicine

## 2023-04-11 ENCOUNTER — Encounter: Payer: Self-pay | Admitting: Family Medicine

## 2023-04-11 DIAGNOSIS — M26609 Unspecified temporomandibular joint disorder, unspecified side: Secondary | ICD-10-CM | POA: Insufficient documentation

## 2023-04-11 NOTE — Assessment & Plan Note (Addendum)
 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 Discussed benefits of mouth guard use and arthroscopy if conservative measures fail. Prescribed Zanaflex for muscle relaxation and pain relief. - Provide TMJ exercises. - Recommend hot and cold compresses. - Prescribe Zanaflex 2 mg at night, option to increase to 4 mg if not drowsy. - Refalen 500 mg BID prn - Advise continued nightly mouth guard use. - Consider referral to dentist for mouth guard reevaluation and jaw adjustment. - Discuss possibility of arthroscopy if conservative measures fail.

## 2023-04-11 NOTE — Assessment & Plan Note (Signed)
 On statin -Refill Crestor

## 2023-04-11 NOTE — Assessment & Plan Note (Signed)
 Requires reevaluation for sleep apnea. Current mouth guard causes soreness. Planned referral for sleep study. - Refer for sleep study to reevaluate sleep apnea.

## 2023-04-11 NOTE — Assessment & Plan Note (Signed)
 Refill Protonix

## 2023-04-24 ENCOUNTER — Other Ambulatory Visit: Payer: Self-pay | Admitting: Family Medicine

## 2023-04-24 DIAGNOSIS — M26609 Unspecified temporomandibular joint disorder, unspecified side: Secondary | ICD-10-CM

## 2023-05-15 ENCOUNTER — Telehealth: Admitting: Nurse Practitioner

## 2023-05-15 DIAGNOSIS — I10 Essential (primary) hypertension: Secondary | ICD-10-CM

## 2023-05-15 NOTE — Patient Instructions (Signed)
  Brandon Huffman, thank you for joining Collins Dean, NP for today's virtual visit.  While this provider is not your primary care provider (PCP), if your PCP is located in our provider database this encounter information will be shared with them immediately following your visit.   A [facility] MyChart account gives you access to today's visit and all your visits, tests, and labs performed at [facility] " click here if you don't have a [facility] MyChart account or go to [email or web address]  Consent: (Patient) Brandon Huffman provided verbal consent for this virtual visit at the beginning of the encounter.  Current Medications:  Current Outpatient Medications:    amLODipine  (NORVASC ) 2.5 MG tablet, Take 1 tablet (2.5 mg total) by mouth 2 (two) times daily., Disp: 180 tablet, Rfl: 3   fluticasone  (FLONASE ) 50 MCG/ACT nasal spray, Place 2 sprays into both nostrils daily., Disp: 16 g, Rfl: 6   Humidifier MISC, , Disp: , Rfl:    levothyroxine (SYNTHROID) 125 MCG tablet, Take 125 mcg by mouth daily before breakfast., Disp: , Rfl:    Multiple Vitamins-Minerals (MULTIVITAMIN WITH MINERALS) tablet, Take 1 tablet daily by mouth. , Disp: , Rfl:    OMEGA-3 FATTY ACIDS PO, Take by mouth., Disp: , Rfl:    OVER THE COUNTER MEDICATION, Take 1 tablet by mouth daily as needed. Privigin - for memory, Disp: , Rfl:    pantoprazole  (PROTONIX ) 40 MG tablet, Take 1 tablet (40 mg total) by mouth daily. 30 minutes, Disp: 90 tablet, Rfl: 3   rosuvastatin  (CRESTOR ) 40 MG tablet, Take 1 tablet (40 mg total) by mouth daily., Disp: 90 tablet, Rfl: 3   tiZANidine  (ZANAFLEX ) 4 MG tablet, TAKE 0.5 TABLETS (2 MG TOTAL) BY MOUTH EVERY 6 (SIX) HOURS AS NEEDED FOR MUSCLE SPASMS., Disp: 180 tablet, Rfl: 1   Triamcinolone Acetonide (NASACORT ALLERGY 24HR NA), Place into the nose as needed., Disp: , Rfl:    Turmeric 500 MG CAPS, Take 500 mg by mouth daily., Disp: , Rfl:    Medications ordered in this encounter:   No orders of the defined types were placed in this encounter.    *If you need refills on other medications prior to your next appointment, please contact your pharmacy*  Follow-Up: Call back or seek an in-person evaluation if the symptoms worsen or if the condition fails to improve as anticipated.  [facility] [phone]  Other Instructions Recommended Emergency room now   If you have been instructed to have an in-person evaluation today at a local Urgent Care facility, please use the link below. It will take you to a list of all of our available [facility], including address, phone number and hours of operation. Please do not delay care.  [facility]  If you or a family member do not have a primary care provider, use the link below to schedule a visit and establish care. When you choose a [facility] primary care physician or advanced practice provider, you gain a long-term partner in health. Find a Primary Care Provider  Learn more about [facility]'s in-office and virtual care options: [facility] - Get Care Now
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
## 2023-05-18 ENCOUNTER — Encounter: Payer: Self-pay | Admitting: Family Medicine

## 2023-05-19 ENCOUNTER — Ambulatory Visit: Payer: Self-pay

## 2023-05-19 NOTE — Telephone Encounter (Signed)
  Call disconnected when agent attempted to transfer to nurse triage, 1st attempt to call patient back and received message "call cannot be completed as dialed".  Copied from CRM [id number]. Topic: Clinical - Red Word Triage >> May 19, 2023  9:17 AM Elle L wrote: Red Word that prompted transfer to Nurse Triage: The patient went to the ER recently due to his high blood pressure and they raised his blood pressure medication but it was causing swelling in his ankles last night. He took half a dose last night and the swelling has gone down. His blood pressure reading this morning was 151/92.

## 2023-05-19 NOTE — Telephone Encounter (Signed)
 Fyi informed pt to schedule an appointment

## 2023-05-19 NOTE — Telephone Encounter (Signed)
  Chief Complaint: hypertension Symptoms: bilateral ankle swelling (resolved), "sinus headache", BP 151/92 Frequency: x 1 day Pertinent Negatives: Patient denies chest pain, difficulty breathing, changes in speech or vision, unilateral numbness or weakness. Disposition: [] ED /[] Urgent Care (no appt availability in office) / [x] Appointment(In office/virtual)/ []  [facility]/ [] Home Care/ [] Refused Recommended Disposition /[] [facility]/ []  Follow-up with PCP Additional Notes: Patient seen in ED on 05/15/23 for hypertension and amlodipine  was increased to take 4 tablets daily. Patient states yesterday he took 3 in the morning and one at night. He states last night his ankles were swollen but resolved this morning when he woke up. Patient states he would like to know if he can add hydrochlorothiazide and decrease his amlodipine , scheduled for visit with PCP.  Copied from CRM [id number]. Topic: Clinical - Red Word Triage >> May 19, 2023  9:17 AM Elle L wrote: Red Word that prompted transfer to Nurse Triage: The patient went to the ER recently due to his high blood pressure and they raised his blood pressure medication but it was causing swelling in his ankles last night. He took half a dose last night and the swelling has gone down. His blood pressure reading this morning was 151/92. Reason for Disposition  [1] Taking BP medications AND [2] feels is having side effects (e.g., impotence, cough, dizzy upon standing)  Answer Assessment - Initial Assessment Questions 1. BLOOD PRESSURE: "What is the blood pressure?" "Did you take at least two measurements 5 minutes apart?"     151/92.  2. ONSET: "When did you take your blood pressure?"     This morning around 8:45am  3. HOW: "How did you take your blood pressure?" (e.g., automatic home BP monitor, visiting nurse)     Automatic home BP monitor.  4. HISTORY: "Do you have a history of high blood pressure?"     Yes.  5. MEDICINES: "Are  you taking any medicines for blood pressure?" "Have you missed any doses recently?"     Amlodipine  2.5mg  was increased to 4 times daily. He states he took 3 yesterday morning and one yesterday evening. He states he took 2 amlodipine  this morning.  6. OTHER SYMPTOMS: "Do you have any symptoms?" (e.g., blurred vision, chest pain, difficulty breathing, headache, weakness)     Bilateral ankle swelling last night but resolved this morning; headaches (states it feels like sinus pressure).   7. PREGNANCY: "Is there any chance you are pregnant?" "When was your last menstrual period?"     N/A.  Protocols used: Blood Pressure - High-A-AH

## 2023-05-21 ENCOUNTER — Encounter: Payer: Self-pay | Admitting: Family Medicine

## 2023-05-21 ENCOUNTER — Ambulatory Visit (INDEPENDENT_AMBULATORY_CARE_PROVIDER_SITE_OTHER): Admitting: Family Medicine

## 2023-05-21 VITALS — BP 126/76 | HR 69 | Temp 97.9°F | Resp 20 | Ht 75.0 in | Wt 203.2 lb

## 2023-05-21 DIAGNOSIS — I10 Essential (primary) hypertension: Secondary | ICD-10-CM

## 2023-05-21 DIAGNOSIS — G44221 Chronic tension-type headache, intractable: Secondary | ICD-10-CM

## 2023-05-21 DIAGNOSIS — D509 Iron deficiency anemia, unspecified: Secondary | ICD-10-CM

## 2023-05-21 MED ORDER — HYDROCHLOROTHIAZIDE 12.5 MG PO TABS: 12.5000 mg | ORAL_TABLET | Freq: Every day | ORAL | 3 refills | Status: DC

## 2023-05-21 NOTE — Patient Instructions (Addendum)
 It was a pleasure meeting you today. Thank you for allowing me to take part in your health care.  Our goals for today as we discussed include:  Continue Amlodipine  2.5 mg two times a day Add hydrochlorothiazide 12.5 mg daily  Monitor blood pressure.  Goal <150/90   We will get some labs today.  If they are abnormal or we need to do something about them, I will call you.  If they are normal, I will send you a message on MyChart (if it is active) or a letter in the mail.  If you don't hear from us  in 2 weeks, please call the office at the number below.   Follow up in 4 weeks   This is a list of the screening recommended for you and due dates:  Health Maintenance  Topic Date Due   Medicare Annual Wellness Visit  06/21/2021   COVID-19 Vaccine (9 - Moderna risk 2024-25 season) 05/07/2023   Flu Shot  08/13/2023   Colon Cancer Screening  09/06/2024   DTaP/Tdap/Td vaccine (2 - Td or Tdap) 06/29/2031   Pneumonia Vaccine  Completed   Hepatitis C Screening  Completed   Zoster (Shingles) Vaccine  Completed   HPV Vaccine  Aged Out   Meningitis B Vaccine  Aged Out      If you have any questions or concerns, please do not hesitate to call the office at [phone].  I look forward to our next visit and until then take care and stay safe.  Regards,   Valli Gaw, MD   [facility]

## 2023-05-21 NOTE — Progress Notes (Unsigned)
 SUBJECTIVE:   Chief Complaint  Patient presents with   Follow-up    Swelling from medication   HPI Presents for follow up ED visit  Discussed the use of AI scribe software for clinical note transcription with the patient, who gave verbal consent to proceed.  History of Present Illness Brandon Huffman is a 73 year old male with hypertension who presents with elevated blood pressure and headaches.  He experienced elevated blood pressure, prompting a visit to the emergency room this past Saturday night. His blood pressure reached as high as 130/107 mmHg, with the lowest recorded at 87 mmHg. Initially on 2.5 mg of medication, the dosage was increased to 10 mg due to rising blood pressure. He has been taking two doses of 2.5 mg in the morning and two at night. His blood pressure appears well-controlled at the current dosage, but he experienced swelling in his ankles when the dosage was increased to 10 mg.  He has persistent headaches, present even when his blood pressure is controlled. Tylenol  provides temporary relief, but the headaches return upon waking. He has not experienced headaches prior to this episode and is unsure if they are related to sinus issues or high blood pressure. He has not been taking NSAIDs or ibuprofen  regularly, but he does take Xyzal and Tylenol  occasionally.  He recently stopped caffeine intake within the last two weeks but has not noticed a significant change in his headache pattern. He is scheduled for a sleep study consultation next Friday. No chest pain, shortness of breath, dizziness, confusion, or gait disturbances. He is mentally alert.     PERTINENT PMH / PSH: As above  OBJECTIVE:  BP 126/76   Pulse 69   Temp 97.9 F (36.6 C)   Resp 20   Ht 6\' 3"  (1.905 m)   Wt 203 lb 4 oz (92.2 kg)   SpO2 98%   BMI 25.40 kg/m    Physical Exam Vitals reviewed.  Constitutional:      General: He is not in acute distress.    Appearance: Normal appearance. He is  normal weight. He is not ill-appearing, toxic-appearing or diaphoretic.  Eyes:     General:        Right eye: No discharge.        Left eye: No discharge.  Cardiovascular:     Rate and Rhythm: Normal rate.  Pulmonary:     Effort: Pulmonary effort is normal.  Musculoskeletal:        General: No swelling. Normal range of motion.     Cervical back: Normal range of motion.  Skin:    General: Skin is warm and dry.  Neurological:     Mental Status: He is alert and oriented to person, place, and time. Mental status is at baseline.  Psychiatric:        Mood and Affect: Mood normal.        Behavior: Behavior normal.        Thought Content: Thought content normal.        Judgment: Judgment normal.           05/21/2023    9:05 AM 04/02/2023    2:10 PM 12/02/2022    8:09 AM 05/06/2022   11:45 AM 08/27/2021    3:46 PM  Depression screen PHQ 2/9  Decreased Interest 0 0 0 0 0  Down, Depressed, Hopeless 0 0 0 0 0  PHQ - 2 Score 0 0 0 0 0  Altered sleeping 3  3 1 3    Tired, decreased energy 1 0 0 1   Change in appetite 0 0 0 0   Feeling bad or failure about yourself  0 0 0 0   Trouble concentrating 0 0 0 2   Moving slowly or fidgety/restless 0 0 0 0   Suicidal thoughts 0 0 0 0   PHQ-9 Score 4 3 1 6    Difficult doing work/chores Not difficult at all Somewhat difficult Not difficult at all Very difficult       05/21/2023    9:05 AM 04/02/2023    2:10 PM 12/02/2022    8:09 AM 05/06/2022   11:45 AM  GAD 7 : Generalized Anxiety Score  Nervous, Anxious, on Edge 0 0 0 1  Control/stop worrying 0 0 0 0  Worry too much - different things 0 0 0 0  Trouble relaxing 0 0 0 1  Restless 0 0 0 0  Easily annoyed or irritable 0 0 0 0  Afraid - awful might happen 0 0 0 0  Total GAD 7 Score 0 0 0 2  Anxiety Difficulty Not difficult at all Not difficult at all Not difficult at all Somewhat difficult    ASSESSMENT/PLAN:  Primary hypertension Assessment & Plan: Hypertension with recent elevated  readings, managed with Norvasc  causing edema. Plan to adjust medication to control blood pressure and manage edema. - Reduce Norvasc  to 5 mg. - Prescribe hydrochlorothiazide 12.5 mg. - Monitor blood pressure to remain below 140/90 mmHg, avoid below 100/60 mmHg. - Check creatinine before starting hydrochlorothiazide and recheck in two weeks.  Orders: -     Comprehensive metabolic panel with GFR -     Basic metabolic panel with GFR; Future  Iron deficiency anemia, unspecified iron deficiency anemia type -     Iron, TIBC and Ferritin Panel -     CBC with Differential/Platelet; Future  Chronic tension-type headache, intractable Assessment & Plan: New headaches possibly linked to hypertension, sinus issues, or caffeine withdrawal. Evaluating for sleep apnea. No neurological deficits. - Proceed with scheduled sleep study. - Consider imaging if headaches persist after sleep study and blood pressure control.     PDMP reviewed  Return in about 4 weeks (around 06/18/2023) for PCP, HTN.  Valli Gaw, MD

## 2023-05-22 LAB — COMPREHENSIVE METABOLIC PANEL WITH GFR
ALT: 27 IU/L (ref 0–44)
AST: 34 IU/L (ref 0–40)
Albumin: 4.6 g/dL (ref 3.8–4.8)
Alkaline Phosphatase: 76 IU/L (ref 44–121)
BUN/Creatinine Ratio: 18 (ref 10–24)
BUN: 15 mg/dL (ref 8–27)
Bilirubin Total: 0.3 mg/dL (ref 0.0–1.2)
CO2: 21 mmol/L (ref 20–29)
Calcium: 9.5 mg/dL (ref 8.6–10.2)
Chloride: 104 mmol/L (ref 96–106)
Creatinine, Ser: 0.84 mg/dL (ref 0.76–1.27)
Globulin, Total: 3.3 g/dL (ref 1.5–4.5)
Glucose: 66 mg/dL — ABNORMAL LOW (ref 70–99)
Potassium: 4.4 mmol/L (ref 3.5–5.2)
Sodium: 139 mmol/L (ref 134–144)
Total Protein: 7.9 g/dL (ref 6.0–8.5)
eGFR: 93 mL/min/{1.73_m2} (ref >59)

## 2023-05-22 LAB — IRON,TIBC AND FERRITIN PANEL
Ferritin: 94 ng/mL (ref 30–400)
Iron Saturation: 33 % (ref 15–55)
Iron: 115 ug/dL (ref 38–169)
Total Iron Binding Capacity: 346 ug/dL (ref 250–450)
UIBC: 231 ug/dL (ref 111–343)

## 2023-05-25 ENCOUNTER — Encounter: Payer: Self-pay | Admitting: Family Medicine

## 2023-05-25 ENCOUNTER — Ambulatory Visit: Payer: Self-pay | Admitting: Family Medicine

## 2023-05-25 ENCOUNTER — Other Ambulatory Visit: Payer: Self-pay | Admitting: Family Medicine

## 2023-05-25 DIAGNOSIS — I10 Essential (primary) hypertension: Secondary | ICD-10-CM

## 2023-05-25 DIAGNOSIS — D509 Iron deficiency anemia, unspecified: Secondary | ICD-10-CM | POA: Insufficient documentation

## 2023-05-25 MED ORDER — HYDROCHLOROTHIAZIDE 12.5 MG PO TABS
25.0000 mg | ORAL_TABLET | Freq: Every day | ORAL | 3 refills | Status: DC
Start: 2023-05-25 — End: 2023-06-18

## 2023-05-25 NOTE — Assessment & Plan Note (Signed)
 New headaches possibly linked to hypertension, sinus issues, or caffeine withdrawal. Evaluating for sleep apnea. No neurological deficits. - Proceed with scheduled sleep study. - Consider imaging if headaches persist after sleep study and blood pressure control.

## 2023-05-25 NOTE — Assessment & Plan Note (Signed)
 Hypertension with recent elevated readings, managed with Norvasc  causing edema. Plan to adjust medication to control blood pressure and manage edema. - Reduce Norvasc  to 5 mg. - Prescribe hydrochlorothiazide 12.5 mg. - Monitor blood pressure to remain below 140/90 mmHg, avoid below 100/60 mmHg. - Check creatinine before starting hydrochlorothiazide and recheck in two weeks.

## 2023-05-26 ENCOUNTER — Encounter: Payer: Self-pay | Admitting: Emergency Medicine

## 2023-05-26 ENCOUNTER — Emergency Department
Admission: EM | Admit: 2023-05-26 | Discharge: 2023-05-26 | Disposition: A | Discharge status: Home / Self Care | Attending: Emergency Medicine | Admitting: Emergency Medicine

## 2023-05-26 ENCOUNTER — Encounter: Payer: Self-pay | Admitting: Family Medicine

## 2023-05-26 ENCOUNTER — Ambulatory Visit: Payer: Self-pay

## 2023-05-26 ENCOUNTER — Emergency Department

## 2023-05-26 ENCOUNTER — Other Ambulatory Visit: Payer: Self-pay

## 2023-05-26 DIAGNOSIS — R519 Headache, unspecified: Secondary | ICD-10-CM | POA: Diagnosis present

## 2023-05-26 DIAGNOSIS — I10 Essential (primary) hypertension: Secondary | ICD-10-CM | POA: Diagnosis not present

## 2023-05-26 LAB — CBC
HCT: 38.8 % — ABNORMAL LOW (ref 39.0–52.0)
Hemoglobin: 13.1 g/dL (ref 13.0–17.0)
MCH: 29.1 pg (ref 26.0–34.0)
MCHC: 33.8 g/dL (ref 30.0–36.0)
MCV: 86.2 fL (ref 80.0–100.0)
Platelets: 180 10*3/uL (ref 150–400)
RBC: 4.5 MIL/uL (ref 4.22–5.81)
RDW: 14.6 % (ref 11.5–15.5)
WBC: 4.2 10*3/uL (ref 4.0–10.5)
nRBC: 0 % (ref 0.0–0.2)

## 2023-05-26 LAB — BASIC METABOLIC PANEL WITH GFR
Anion gap: 8 (ref 5–15)
BUN: 22 mg/dL (ref 8–23)
CO2: 28 mmol/L (ref 22–32)
Calcium: 9.3 mg/dL (ref 8.9–10.3)
Chloride: 100 mmol/L (ref 98–111)
Creatinine, Ser: 0.88 mg/dL (ref 0.61–1.24)
GFR, Estimated: >60 mL/min (ref >60)
Glucose, Bld: 106 mg/dL — ABNORMAL HIGH (ref 70–99)
Potassium: 4.1 mmol/L (ref 3.5–5.1)
Sodium: 136 mmol/L (ref 135–145)

## 2023-05-26 MED ORDER — METOCLOPRAMIDE HCL 5 MG/ML IJ SOLN
10.0000 mg | Freq: Once | INTRAMUSCULAR | Status: AC
  Administered 2023-05-26: 10 mg via INTRAVENOUS
  Filled 2023-05-26: qty 2

## 2023-05-26 MED ORDER — KETOROLAC TROMETHAMINE 30 MG/ML IJ SOLN
15.0000 mg | Freq: Once | INTRAMUSCULAR | Status: AC
  Administered 2023-05-26: 15 mg via INTRAVENOUS
  Filled 2023-05-26: qty 1

## 2023-05-26 MED ORDER — DIPHENHYDRAMINE HCL 50 MG/ML IJ SOLN
25.0000 mg | Freq: Once | INTRAMUSCULAR | Status: AC
  Administered 2023-05-26: 25 mg via INTRAVENOUS
  Filled 2023-05-26: qty 1

## 2023-05-26 MED ORDER — SODIUM CHLORIDE 0.9 % IV BOLUS
500.0000 mL | Freq: Once | INTRAVENOUS | Status: AC
  Administered 2023-05-26: 500 mL via INTRAVENOUS

## 2023-05-26 NOTE — ED Provider Notes (Signed)
 [facility] Provider Note    Event Date/Time   First MD Initiated Contact with Patient 05/26/23 2109     (approximate)  History   Chief Complaint: Headache and Hypertension  HPI  Brandon Huffman is a 73 y.o. male with a past medical history of gastric reflux, hypertension, presents to the emergency department for headache.  According to the patient for the last 2 weeks or so he has been experiencing a near daily headache.  Patient describes as a pressure sensation in his face and head he believes it could likely be sinus related.  States at times he states mild nasal drainage.  Denies any significant congestion however.  Denies any fever.  No cough.  Patient denies any weakness numbness confusion or slurred speech.  Patient states he checked his blood pressure today and it was elevated.  Patient was worried so he came to the emergency department for evaluation.  Physical Exam   Triage Vital Signs: ED Triage Vitals  Encounter Vitals Group     BP 05/26/23 2051 (!) 143/99     Systolic BP Percentile --      Diastolic BP Percentile --      Pulse Rate 05/26/23 2051 73     Resp 05/26/23 2051 18     Temp 05/26/23 2051 97.8 F (36.6 C)     Temp src --      SpO2 05/26/23 2051 100 %     Weight 05/26/23 2050 203 lb 3.9 oz (92.2 kg)     Height 05/26/23 2050 6\' 3"  (1.905 m)     Head Circumference --      Peak Flow --      Pain Score 05/26/23 2050 10     Pain Loc --      Pain Education --      Exclude from Growth Chart --     Most recent vital signs: Vitals:   05/26/23 2051  BP: (!) 143/99  Pulse: 73  Resp: 18  Temp: 97.8 F (36.6 C)  SpO2: 100%    General: Awake, no distress.  CV:  Good peripheral perfusion.  Regular rate and rhythm  Resp:  Normal effort.  Equal breath sounds bilaterally.  Abd:  No distention.  ED Results / Procedures / Treatments   RADIOLOGY  I have reviewed interpret the CT head images.  No significant bleed on seen on my  evaluation. Radiology is read the head CT is negative.   MEDICATIONS ORDERED IN ED: Medications  ketorolac (TORADOL) 30 MG/ML injection 15 mg (has no administration in time range)  metoCLOPramide (REGLAN) injection 10 mg (has no administration in time range)  diphenhydrAMINE (BENADRYL) injection 25 mg (has no administration in time range)  sodium chloride 0.9 % bolus 500 mL (has no administration in time range)     IMPRESSION / MDM / ASSESSMENT AND PLAN / ED COURSE  I reviewed the triage vital signs and the nursing notes.  Patient's presentation is most consistent with acute presentation with potential threat to life or bodily function.  Patient presents to the emergency department for headache and hypertension.  Overall the patient appears well, no distress.  Reassuring physical exam.  Reassuring vital signs blood pressure currently 143/99.  Given the patient's significant headache however we will obtain a CT scan of the head as a precaution to rule out intracranial abnormality.  Will treat with Toradol Reglan Benadryl and check basic labs.  Will continue to close monitor while awaiting results.  Patient agreeable to plan.  CT scan of the head is reassuring.  No acute findings.  Patient states his headache is feeling much better very minimal at this time.  Discussed with patient trying to go home and get a full nights rest using Tylenol  tomorrow if needed for any residual headache.  Discussed return precautions for any significant headache any weakness or numbness or slurred speech.  Patient agreeable to plan.  FINAL CLINICAL IMPRESSION(S) / ED DIAGNOSES   Headache   Note:  This document was prepared using Dragon voice recognition software and may include unintentional dictation errors.   Ruth Cove, MD 05/26/23 2236

## 2023-05-26 NOTE — Discharge Instructions (Signed)
 As we discussed please go home and try to obtain a full night sleep.  You may use Tylenol  1000 mg when you awaken for any residual headache.  Return to the emergency department for any significant headache, fever, slurred speech weakness or numbness of any arm or leg or any other symptom concerning to yourself.  Otherwise please follow-up with your doctor in the next Avril days for recheck/reevaluation.

## 2023-05-26 NOTE — Telephone Encounter (Signed)
 Pt currently unable to talk pt, stated he would give office a call back. Following up on pt's mychart message about bp being elevated with headache. Please triage pt when he calls back thx

## 2023-05-26 NOTE — ED Triage Notes (Addendum)
 Pt presents to the ED via POV with complaints of headache x 2 weeks. He notes having fluctuating BP readings and had to have his meds have recently been adjusted last week. Of note, the patient was seen at [facility] last week for same. A&Ox4 at this time. Denies vision changes, dizziness, CP or SOB.

## 2023-05-26 NOTE — Telephone Encounter (Signed)
 Chief Complaint: hypertension Symptoms: hypertension and headache Frequency: since yesterday Pertinent Negatives: Patient denies sob Disposition: [] ED /[] Urgent Care (no appt availability in office) / [] Appointment(In office/virtual)/ []  [place] [facility]/ [] Home Care/ [] Refused Recommended Disposition /[] [place] [facility]/ [x]  Follow-up with PCP  Additional Notes: pt states that he has been dealing with hypertension and  155/99 this morning. States that he took all of his medication on yesterday.  States that he has taken his morning medication. States that he took 25 mg of hydralazine and 5mg  of amlodipine . States the headache is still none stop even with taking the tylenol . States that it could be sinus or could be his BP.  Pt retook  and was 134/86 and P 80.  Then reading 119/82.   Copied from CRM [id number]. Topic: Clinical - Red Word Triage >> May 26, 2023 10:45 AM Baldo Levan wrote: Red Word that prompted transfer to Nurse Triage: Patient is still having high BP and was told to give a call back. Reason for Disposition  [1] Systolic BP  >= 130 OR Diastolic >= 80 AND [2] taking BP medications  Protocols used: Blood Pressure - High-A-AH

## 2023-05-28 ENCOUNTER — Ambulatory Visit (INDEPENDENT_AMBULATORY_CARE_PROVIDER_SITE_OTHER): Admitting: Sleep Medicine

## 2023-05-28 ENCOUNTER — Encounter: Payer: Self-pay | Admitting: Sleep Medicine

## 2023-05-28 VITALS — BP 120/78 | HR 78 | Temp 98.3°F | Ht 75.0 in | Wt 204.2 lb

## 2023-05-28 DIAGNOSIS — G4733 Obstructive sleep apnea (adult) (pediatric): Secondary | ICD-10-CM | POA: Diagnosis not present

## 2023-05-28 DIAGNOSIS — I10 Essential (primary) hypertension: Secondary | ICD-10-CM

## 2023-05-28 DIAGNOSIS — G4700 Insomnia, unspecified: Secondary | ICD-10-CM | POA: Diagnosis not present

## 2023-05-28 DIAGNOSIS — F5104 Psychophysiologic insomnia: Secondary | ICD-10-CM

## 2023-05-28 NOTE — Telephone Encounter (Signed)
 LM for patient

## 2023-05-28 NOTE — Patient Instructions (Signed)
 Brandon Huffman

## 2023-05-28 NOTE — Progress Notes (Signed)
 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 w/ a h/o OSA, hypothyroidism, HTN, GERD and hyperlipidemia who presents for reassessment of OSA. Reports that he was initially diagnosed with OSA around 10-15 years ago and subsequently started on CPAP therapy, which he used for 1 year. States that he discontinued CPAP therapy due to discomfort.   Reports c/o loud snoring, witnessed apnea and excessive daytime sleepiness which has been present for several years. Reports nocturnal awakenings due to unclear reasons, however does not have difficulty falling back to sleep. Admits to dry mouth and headaches. Denies RLS symptoms, dream enactment, cataplexy, hypnagogic or hypnapompic hallucinations. Denies a family history of sleep apnea. Occasional drowsy driving. Drinks 1 cup of coffee daily, occasional alcohol use, denies tobacco or illicit drug use.   Bedtime 10-11 pm Sleep onset 1-2 hours Rise time 6 am   EPWORTH SLEEP SCORE 1    05/28/2023    9:00 AM  Results of the Epworth flowsheet  Sitting and reading 0  Watching TV 1  Sitting, inactive in a public place (e.g. a theatre or a meeting) 0  As a passenger in a car for an hour without a break 0  Lying down to rest in the afternoon when circumstances permit 0  Sitting and talking to someone 0  Sitting quietly after a lunch without alcohol 0  In a car, while stopped for a few minutes in traffic 0  Total score 1    PAST MEDICAL HISTORY :   has a past medical history of Adult ADHD, Allergy, Cancer (HCC) (2012), Family hx of colon cancer (05/08/2016), GERD (gastroesophageal reflux disease), History of implantation of artificial sphincter, malignant neoplasm of prostate (05/08/2016), malignant neoplasm of prostate (05/08/2016), Hypertension (05/13/23), Insomnia, Proteinuria (03/11/2018), Sleep apnea, Thyroid  disease (12/10/2022), and  Varicose vein of leg.  has a past surgical history that includes Colonoscopy (2016); Varicose vein surgery (Left, 1972); artificaill sphincter (12/2015); Prostatectomy (2012); Hernia repair (2011); Penile prosthesis implant; and Umbilical hernia repair (N/A, 12/11/2016). Prior to Admission medications   Medication Sig Start Date End Date Taking? Authorizing Provider  acetaminophen  (TYLENOL ) 325 MG tablet Take 650 mg by mouth as needed. 12/13/21  Yes [provider]  amLODipine  (NORVASC ) 2.5 MG tablet Take 1 tablet (2.5 mg total) by mouth 2 (two) times daily. 12/02/22  Yes Valli Gaw, MD  Flaxseed, Linseed, (FLAXSEED OIL PO) 1 capsule by Other route daily.   Yes [provider]  fluticasone  (FLONASE ) 50 MCG/ACT nasal spray Place 2 sprays into both nostrils daily. 12/02/22  Yes Valli Gaw, MD  Humidifier MISC  04/18/12  Yes [provider]  hydrochlorothiazide (HYDRODIURIL) 12.5 MG tablet Take 2 tablets (25 mg total) by mouth daily. 05/25/23  Yes Walsh, Tanya, MD  levocetirizine (XYZAL) 5 MG tablet Take 5 mg by mouth every evening.   Yes [provider]  levothyroxine (SYNTHROID) 125 MCG tablet Take 125 mcg by mouth daily before breakfast.   Yes [provider]  Multiple Vitamins-Minerals (MULTIVITAMIN WITH MINERALS) tablet Take 1 tablet daily by mouth.    Yes [provider]  OMEGA-3 FATTY ACIDS PO Take by mouth.   Yes [provider]  OVER THE COUNTER MEDICATION Take 1 tablet by mouth daily as needed. Privigin - for memory   Yes [provider]  oxybutynin (DITROPAN-XL) 10 MG 24 hr tablet Take  1 tablet by mouth daily. 06/16/22  Yes [provider]  pantoprazole  (PROTONIX ) 20 MG tablet Take 20 mg by mouth daily. 05/12/23  Yes [provider]  rosuvastatin  (CRESTOR ) 40 MG tablet Take 1 tablet (40 mg total) by mouth daily. 04/02/23  Yes Valli Gaw, MD  tiZANidine  (ZANAFLEX ) 4 MG tablet TAKE 0.5 TABLETS (2 MG TOTAL)  BY MOUTH EVERY 6 (SIX) HOURS AS NEEDED FOR MUSCLE SPASMS. 04/28/23  Yes Walsh, Tanya, MD  Triamcinolone Acetonide (NASACORT ALLERGY 24HR NA) Place into the nose as needed.   Yes [provider]  Turmeric 500 MG CAPS Take 500 mg by mouth daily.   Yes [provider]   Allergies  Allergen Reactions   Chocolate Flavoring Agent (Non-Screening) Other (See Comments)    Cysts under the skin.   Pollen Extract Other (See Comments)    Nasal congestion.   Tetracyclines & Related Hives and Rash    FAMILY HISTORY:  family history includes ADD / ADHD in his daughter and son; COPD in his brother; Cancer in his brother, brother, brother, brother, sister, and sister; Dementia in his father; Diabetes in his brother; Irritable bowel syndrome in his brother; Pneumonia in his brother and mother. SOCIAL HISTORY:  reports that he has never smoked. He has never used smokeless tobacco. He reports current alcohol use of about 2.0 standard drinks of alcohol per week. He reports that he does not use drugs.   Review of Systems:  Gen:  Denies  fever, sweats, chills weight loss  HEENT: Denies blurred vision, double vision, ear pain, eye pain, hearing loss, nose bleeds, sore throat Cardiac:  No dizziness, chest pain or heaviness, chest tightness,edema, No JVD Resp:   No cough, -sputum production, -shortness of breath,-wheezing, -hemoptysis,  Gi: Denies swallowing difficulty, stomach pain, nausea or vomiting, diarrhea, constipation, bowel incontinence Gu:  Denies bladder incontinence, burning urine Ext:   Denies Joint pain, stiffness or swelling Skin: Denies  skin rash, easy bruising or bleeding or hives Endoc:  Denies polyuria, polydipsia , polyphagia or weight change Psych:   Denies depression, insomnia or hallucinations  Other:  All other systems negative  VITAL SIGNS: BP 120/78 (BP Location: Right Arm, Patient Position: Sitting, Cuff Size: Normal)   Pulse 78   Temp 98.3 F (36.8 C) (Oral)    Ht 6\' 3"  (1.905 m)   Wt 204 lb 3.2 oz (92.6 kg)   SpO2 94%   BMI 25.52 kg/m    Physical Examination:   General Appearance: No distress  EYES PERRLA, EOM intact.   NECK Supple, No JVD Pulmonary: normal breath sounds, No wheezing.  CardiovascularNormal S1,S2.  No m/r/g.   Abdomen: Benign, Soft, non-tender. Skin:   warm, no rashes, no ecchymosis  Extremities: normal, no cyanosis, clubbing. Neuro:without focal findings,  speech normal  PSYCHIATRIC: Mood, affect within normal limits.   ASSESSMENT AND PLAN  OSA I suspect that OSA is likely still present due to clinical presentation. Will complete reassessment with a home sleep study. Discussed the consequences of untreated sleep apnea. Advised not to drive drowsy for safety of patient and others. Will complete further evaluation with a home sleep study and follow up to review results.    HTN Stable, on current management. Following with PCP.   Psychophysiologic insomnia Counseled patient on stimulus control and improving sleep hygiene practices.    MEDICATION ADJUSTMENTS/LABS AND TESTS ORDERED: Recommend Sleep Study   Patient  satisfied with Plan of action and management. All questions answered  Follow up  to review HST results and treatment plan.   I spent a total of 47 minutes reviewing chart data, face-to-face evaluation with the patient, counseling and coordination of care as detailed above.    Maryellen Dowdle, M.D.  Sleep Medicine [facility]

## 2023-05-28 NOTE — Telephone Encounter (Signed)
 LM  for pt to schedule acute appt with Charan this AM

## 2023-05-31 NOTE — Telephone Encounter (Signed)
 Pt was seen in ED on 05/26/23 for headache and high blood pressure

## 2023-06-06 ENCOUNTER — Other Ambulatory Visit: Payer: Self-pay | Admitting: Family Medicine

## 2023-06-06 DIAGNOSIS — M26609 Unspecified temporomandibular joint disorder, unspecified side: Secondary | ICD-10-CM

## 2023-06-14 ENCOUNTER — Telehealth: Payer: Self-pay | Admitting: *Deleted

## 2023-06-17 NOTE — Telephone Encounter (Signed)
 Order are in for lab work. Called pt and left a message with this information

## 2023-06-18 ENCOUNTER — Encounter: Payer: Self-pay | Admitting: Family Medicine

## 2023-06-18 ENCOUNTER — Ambulatory Visit (INDEPENDENT_AMBULATORY_CARE_PROVIDER_SITE_OTHER): Admitting: Family Medicine

## 2023-06-18 DIAGNOSIS — I10 Essential (primary) hypertension: Secondary | ICD-10-CM | POA: Diagnosis not present

## 2023-06-18 DIAGNOSIS — D509 Iron deficiency anemia, unspecified: Secondary | ICD-10-CM | POA: Diagnosis not present

## 2023-06-18 MED ORDER — HYDROCHLOROTHIAZIDE 25 MG PO TABS: 25.0000 mg | ORAL_TABLET | Freq: Every day | ORAL | 3 refills | Status: AC

## 2023-06-18 MED ORDER — AMLODIPINE BESYLATE 2.5 MG PO TABS: 2.5000 mg | ORAL_TABLET | Freq: Two times a day (BID) | ORAL | 3 refills | Status: AC

## 2023-06-18 NOTE — Patient Instructions (Addendum)
 It was a pleasure meeting you today. Thank you for allowing me to take part in your health care.  Our goals for today as we discussed include:  Blood pressure looks great Continue current medications  Refill sent for medications  We will get some labs today.  If they are abnormal or we need to do something about them, I will call you.  If they are normal, I will send you a message on MyChart (if it is active) or a letter in the mail.  If you don't hear from us  in 2 weeks, please call the office at the number below.     This is a list of the screening recommended for you and due dates:  Health Maintenance  Topic Date Due   Medicare Annual Wellness Visit  06/21/2021   COVID-19 Vaccine (9 - Moderna risk 2024-25 season) 05/07/2023   Flu Shot  08/13/2023   Colon Cancer Screening  09/06/2024   DTaP/Tdap/Td vaccine (2 - Td or Tdap) 06/29/2031   Pneumonia Vaccine  Completed   Hepatitis C Screening  Completed   Zoster (Shingles) Vaccine  Completed   HPV Vaccine  Aged Out   Meningitis B Vaccine  Aged Out      If you have any questions or concerns, please do not hesitate to call the office at [phone].  I look forward to our next visit and until then take care and stay safe.  Regards,   Valli Gaw, MD   [facility]

## 2023-06-18 NOTE — Progress Notes (Signed)
 SUBJECTIVE:   Chief Complaint  Patient presents with   Hypertension   HPI Presents for follow up chronic disease management  Discussed the use of AI scribe software for clinical note transcription with the patient, who gave verbal consent to proceed.  History of Present Illness Brandon Huffman is a 73 year old male with hypertension who presents for a follow-up on blood pressure management.  His blood pressure has stabilized after initial fluctuations following medication adjustments. He is currently taking hydrochlorothiazide  25 mg daily, which he takes as two 12.5 mg tablets, and amlodipine  2.5 mg twice daily. His home blood pressure readings have been lower, around 108/64 mmHg. No dizziness, chest pain, shortness of breath, or headaches.  He recently visited his urologist and had blood work done for PSA levels. He also underwent an endoscopy earlier this week. He has not had blood work done specifically after starting hydrochlorothiazide . He received a message from [facility] about scheduling lab tests but was unsure of the origin of the order.  He is concerned about running out of hydrochlorothiazide  as he has been taking two tablets daily. He initially picked up a prescription for 180 tablets with three refills.  He is actively trying to manage his health through exercise and diet. He has no history of kidney issues, heart attacks, or diabetes.     PERTINENT PMH / PSH: As above  OBJECTIVE:  BP 108/64   Pulse 70   Temp 97.6 F (36.4 C)   Resp 20   Ht 6\' 3"  (1.905 m)   Wt 206 lb 6 oz (93.6 kg)   SpO2 98%   BMI 25.80 kg/m    Physical Exam Vitals reviewed.  Constitutional:      General: He is not in acute distress.    Appearance: Normal appearance. He is not ill-appearing, toxic-appearing or diaphoretic.  Eyes:     General:        Right eye: No discharge.        Left eye: No discharge.  Cardiovascular:     Rate and Rhythm: Normal rate.  Pulmonary:     Effort:  Pulmonary effort is normal.  Skin:    General: Skin is warm and dry.  Neurological:     Mental Status: He is alert and oriented to person, place, and time. Mental status is at baseline.  Psychiatric:        Mood and Affect: Mood normal.        Behavior: Behavior normal.        Thought Content: Thought content normal.        Judgment: Judgment normal.           06/18/2023   11:10 AM 05/21/2023    9:05 AM 04/02/2023    2:10 PM 12/02/2022    8:09 AM 05/06/2022   11:45 AM  Depression screen PHQ 2/9  Decreased Interest 0 0 0 0 0  Down, Depressed, Hopeless 0 0 0 0 0  PHQ - 2 Score 0 0 0 0 0  Altered sleeping 2 3 3 1 3   Tired, decreased energy 0 1 0 0 1  Change in appetite 0 0 0 0 0  Feeling bad or failure about yourself  0 0 0 0 0  Trouble concentrating 0 0 0 0 2  Moving slowly or fidgety/restless 0 0 0 0 0  Suicidal thoughts 0 0 0 0 0  PHQ-9 Score 2 4 3 1 6   Difficult doing work/chores Not difficult at all  Not difficult at all Somewhat difficult Not difficult at all Very difficult      06/18/2023   11:11 AM 05/21/2023    9:05 AM 04/02/2023    2:10 PM 12/02/2022    8:09 AM  GAD 7 : Generalized Anxiety Score  Nervous, Anxious, on Edge 0 0 0 0  Control/stop worrying 0 0 0 0  Worry too much - different things 0 0 0 0  Trouble relaxing 0 0 0 0  Restless 0 0 0 0  Easily annoyed or irritable 0 0 0 0  Afraid - awful might happen 0 0 0 0  Total GAD 7 Score 0 0 0 0  Anxiety Difficulty Not difficult at all Not difficult at all Not difficult at all Not difficult at all    ASSESSMENT/PLAN:  Primary hypertension Assessment & Plan: Blood pressure well-controlled at 108/64 mmHg. Current regimen includes hydrochlorothiazide  and amlodipine .   - Refill hydrochlorothiazide  to 25 mg daily. - Refill  amlodipine  2.5 mg twice daily. - Monitor blood pressure at home; report if <100/60 mmHg or symptomatic. - Order blood work for kidney function.  Orders: -     Basic metabolic panel with GFR -      hydroCHLOROthiazide ; Take 1 tablet (25 mg total) by mouth daily.  Dispense: 90 tablet; Refill: 3 -     amLODIPine  Besylate; Take 1 tablet (2.5 mg total) by mouth 2 (two) times daily.  Dispense: 180 tablet; Refill: 3     PDMP reviewed  Return in about 6 months (around 12/18/2023) for PCP.  Valli Gaw, MD

## 2023-06-19 LAB — BASIC METABOLIC PANEL WITH GFR
BUN/Creatinine Ratio: 22 (ref 10–24)
BUN: 19 mg/dL (ref 8–27)
CO2: 22 mmol/L (ref 20–29)
Calcium: 9.3 mg/dL (ref 8.6–10.2)
Chloride: 102 mmol/L (ref 96–106)
Creatinine, Ser: 0.87 mg/dL (ref 0.76–1.27)
Glucose: 87 mg/dL (ref 70–99)
Potassium: 5.1 mmol/L (ref 3.5–5.2)
Sodium: 138 mmol/L (ref 134–144)
eGFR: 92 mL/min/{1.73_m2} (ref >59)

## 2023-06-20 ENCOUNTER — Encounter: Payer: Self-pay | Admitting: Family Medicine

## 2023-06-20 NOTE — Assessment & Plan Note (Addendum)
 Blood pressure well-controlled at 108/64 mmHg. Current regimen includes hydrochlorothiazide  and amlodipine .   - Refill hydrochlorothiazide  to 25 mg daily. - Refill  amlodipine  2.5 mg twice daily. - Monitor blood pressure at home; report if <100/60 mmHg or symptomatic. - Order blood work for kidney function.

## 2023-07-01 ENCOUNTER — Other Ambulatory Visit: Payer: Self-pay | Admitting: Family Medicine

## 2023-08-10 ENCOUNTER — Other Ambulatory Visit: Payer: Self-pay | Admitting: Medical Genetics

## 2023-08-13 ENCOUNTER — Other Ambulatory Visit
Admission: RE | Admit: 2023-08-13 | Discharge: 2023-08-13 | Disposition: A | Discharge status: Home / Self Care | Payer: Self-pay | Source: Ambulatory Visit | Attending: Medical Genetics | Admitting: Medical Genetics

## 2023-08-24 LAB — GENECONNECT MOLECULAR SCREEN: Genetic Analysis Overall Interpretation: NEGATIVE

## 2023-09-23 ENCOUNTER — Other Ambulatory Visit: Payer: Self-pay

## 2023-09-23 DIAGNOSIS — Z9109 Other allergy status, other than to drugs and biological substances: Secondary | ICD-10-CM

## 2023-09-23 MED ORDER — FLUTICASONE PROPIONATE 50 MCG/ACT NA SUSP: 2.0000 | Freq: Every day | NASAL | 0 refills | Status: AC

## 2023-09-23 NOTE — Telephone Encounter (Signed)
 Refill for fluticasone  sent. Pt has TOC with Dr. Abbey 10/29/23.

## 2023-10-18 ENCOUNTER — Encounter

## 2023-10-21 ENCOUNTER — Telehealth: Payer: Self-pay

## 2023-10-21 NOTE — Telephone Encounter (Signed)
 Lm and sent Mychart message to reschedule TOC  E2C2 please reschedule. NO TOC's on 01/05/24 or 01/12/2024.

## 2023-10-26 ENCOUNTER — Telehealth (INDEPENDENT_AMBULATORY_CARE_PROVIDER_SITE_OTHER): Admitting: Nurse Practitioner

## 2023-10-26 ENCOUNTER — Encounter

## 2023-10-26 ENCOUNTER — Encounter: Payer: Self-pay | Admitting: Nurse Practitioner

## 2023-10-26 VITALS — Ht 75.0 in | Wt 200.0 lb

## 2023-10-26 DIAGNOSIS — J324 Chronic pansinusitis: Secondary | ICD-10-CM

## 2023-10-26 DIAGNOSIS — E782 Mixed hyperlipidemia: Secondary | ICD-10-CM

## 2023-10-26 DIAGNOSIS — R519 Headache, unspecified: Secondary | ICD-10-CM | POA: Diagnosis not present

## 2023-10-26 DIAGNOSIS — I10 Essential (primary) hypertension: Secondary | ICD-10-CM

## 2023-10-26 DIAGNOSIS — G8929 Other chronic pain: Secondary | ICD-10-CM | POA: Diagnosis not present

## 2023-10-26 DIAGNOSIS — R7309 Other abnormal glucose: Secondary | ICD-10-CM

## 2023-10-26 DIAGNOSIS — E785 Hyperlipidemia, unspecified: Secondary | ICD-10-CM

## 2023-10-26 MED ORDER — AMOXICILLIN-POT CLAVULANATE 875-125 MG PO TABS
1.0000 | ORAL_TABLET | Freq: Two times a day (BID) | ORAL | 0 refills | Status: AC
Start: 2023-10-26 — End: ?
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
## 2023-10-29 ENCOUNTER — Encounter

## 2023-11-01 DIAGNOSIS — J324 Chronic pansinusitis: Secondary | ICD-10-CM | POA: Insufficient documentation

## 2023-11-01 NOTE — Assessment & Plan Note (Signed)
 Suspected due to persistent frontal headache and sinus pressure. No relief from Flonase  and Mucinex D. - Prescribe Augmentin for 7 days. - Continue Flonase  nasal spray.
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
## 2023-11-12 ENCOUNTER — Ambulatory Visit
Admission: RE | Admit: 2023-11-12 | Discharge: 2023-11-12 | Disposition: A | Discharge status: Home / Self Care | Source: Ambulatory Visit | Attending: Nurse Practitioner | Admitting: Nurse Practitioner

## 2023-11-12 DIAGNOSIS — R519 Headache, unspecified: Secondary | ICD-10-CM | POA: Diagnosis present

## 2023-11-12 DIAGNOSIS — G8929 Other chronic pain: Secondary | ICD-10-CM | POA: Insufficient documentation

## 2023-11-17 ENCOUNTER — Ambulatory Visit: Payer: Self-pay | Admitting: Nurse Practitioner

## 2023-11-17 NOTE — Progress Notes (Signed)
 No acute changes on the CT head. How are the headaches now?

## 2023-11-30 ENCOUNTER — Telehealth: Payer: Self-pay

## 2023-11-30 NOTE — Telephone Encounter (Signed)
 Arley KATHEE Benders  P [facility] Admin4 minutes ago (1:48 PM)   Appointment canceled for Brandon Huffman ([id number]) Visit type: TRANSFER OF CARE 02/14/2024 2:00 PM (30 minutes) with Luke Shade in [facility]   Reason for cancellation: Unhappy/Changed Provider   Patient comments: I have moved my primary provider to [facility] provider Dr. Corrie

## 2023-12-17 ENCOUNTER — Other Ambulatory Visit: Payer: Self-pay | Admitting: Otolaryngology

## 2023-12-17 DIAGNOSIS — E041 Nontoxic single thyroid nodule: Secondary | ICD-10-CM

## 2024-01-04 ENCOUNTER — Ambulatory Visit
Admission: RE | Admit: 2024-01-04 | Discharge: 2024-01-04 | Disposition: A | Discharge status: Home / Self Care | Source: Ambulatory Visit | Attending: Otolaryngology | Admitting: Otolaryngology

## 2024-01-04 DIAGNOSIS — E041 Nontoxic single thyroid nodule: Secondary | ICD-10-CM

## 2024-01-12 ENCOUNTER — Encounter

## 2024-01-18 ENCOUNTER — Encounter

## 2024-01-18 DIAGNOSIS — G4733 Obstructive sleep apnea (adult) (pediatric): Secondary | ICD-10-CM

## 2024-01-28 DIAGNOSIS — G4733 Obstructive sleep apnea (adult) (pediatric): Secondary | ICD-10-CM | POA: Diagnosis not present

## 2024-02-01 ENCOUNTER — Ambulatory Visit: Payer: Self-pay

## 2024-02-01 DIAGNOSIS — F5104 Psychophysiologic insomnia: Secondary | ICD-10-CM

## 2024-02-01 DIAGNOSIS — G4733 Obstructive sleep apnea (adult) (pediatric): Secondary | ICD-10-CM

## 2024-02-01 DIAGNOSIS — I10 Essential (primary) hypertension: Secondary | ICD-10-CM

## 2024-02-01 NOTE — Telephone Encounter (Signed)
 Split night study ordered. NFN

## 2024-02-10 ENCOUNTER — Encounter

## 2024-02-14 ENCOUNTER — Encounter

## 2024-02-16 NOTE — Telephone Encounter (Signed)
 I called [facility] and spoke with Renda she stated they had been having issues with the phones.Renda stated she would contact the patient to get him scheduled
